# Patient Record
Sex: Male | Born: 1980 | Race: Black or African American | Hispanic: No | Marital: Single | Smoking: Current every day smoker
Health system: Southern US, Community
[De-identification: ages and names within clinical notes are randomized; demographics above are authoritative.]

## PROBLEM LIST (undated history)

## (undated) DIAGNOSIS — Z789 Other specified health status: Secondary | ICD-10-CM

## (undated) HISTORY — PX: NO PAST SURGERIES: SHX2092

---

## 2013-10-01 ENCOUNTER — Emergency Department (HOSPITAL_COMMUNITY)
Admission: EM | Admit: 2013-10-01 | Discharge: 2013-10-01 | Disposition: A | Discharge status: Home / Self Care | Payer: Self-pay | Attending: Emergency Medicine | Admitting: Emergency Medicine

## 2013-10-01 ENCOUNTER — Encounter (HOSPITAL_COMMUNITY): Payer: Self-pay | Admitting: Emergency Medicine

## 2013-10-01 DIAGNOSIS — F17200 Nicotine dependence, unspecified, uncomplicated: Secondary | ICD-10-CM | POA: Insufficient documentation

## 2013-10-01 DIAGNOSIS — IMO0002 Reserved for concepts with insufficient information to code with codable children: Secondary | ICD-10-CM | POA: Insufficient documentation

## 2013-10-01 DIAGNOSIS — M62838 Other muscle spasm: Secondary | ICD-10-CM

## 2013-10-01 DIAGNOSIS — M7918 Myalgia, other site: Secondary | ICD-10-CM

## 2013-10-01 DIAGNOSIS — M542 Cervicalgia: Secondary | ICD-10-CM | POA: Insufficient documentation

## 2013-10-01 DIAGNOSIS — M25519 Pain in unspecified shoulder: Secondary | ICD-10-CM | POA: Insufficient documentation

## 2013-10-01 DIAGNOSIS — M25511 Pain in right shoulder: Secondary | ICD-10-CM

## 2013-10-01 DIAGNOSIS — M75100 Unspecified rotator cuff tear or rupture of unspecified shoulder, not specified as traumatic: Secondary | ICD-10-CM | POA: Insufficient documentation

## 2013-10-01 DIAGNOSIS — M25512 Pain in left shoulder: Secondary | ICD-10-CM

## 2013-10-01 MED ORDER — OXYCODONE-ACETAMINOPHEN 5-325 MG PO TABS
1.0000 | ORAL_TABLET | Freq: Once | ORAL | Status: AC
  Administered 2013-10-01: 1 via ORAL
  Filled 2013-10-01: qty 1

## 2013-10-01 MED ORDER — CYCLOBENZAPRINE HCL 10 MG PO TABS: 10.0000 mg | ORAL_TABLET | Freq: Two times a day (BID) | ORAL | Status: DC | PRN

## 2013-10-01 MED ORDER — HYDROCODONE-ACETAMINOPHEN 5-325 MG PO TABS: 2.0000 | ORAL_TABLET | ORAL | Status: DC | PRN

## 2013-10-01 MED ORDER — NAPROXEN 500 MG PO TABS: 500.0000 mg | ORAL_TABLET | Freq: Two times a day (BID) | ORAL | Status: DC

## 2013-10-01 NOTE — Discharge Instructions (Signed)
Shoulder Pain The shoulder is the joint that connects your arms to your body. The bones that form the shoulder joint include the upper arm bone (humerus), the shoulder blade (scapula), and the collarbone (clavicle). The top of the humerus is shaped like a ball and fits into a rather flat socket on the scapula (glenoid cavity). A combination of muscles and strong, fibrous tissues that connect muscles to bones (tendons) support your shoulder joint and hold the ball in the socket. Small, fluid-filled sacs (bursae) are located in different areas of the joint. They act as cushions between the bones and the overlying soft tissues and help reduce friction between the gliding tendons and the bone as you move your arm. Your shoulder joint allows a wide range of motion in your arm. This range of motion allows you to do things like scratch your back or throw a ball. However, this range of motion also makes your shoulder more prone to pain from overuse and injury. Causes of shoulder pain can originate from both injury and overuse and usually can be grouped in the following four categories:  Redness, swelling, and pain (inflammation) of the tendon (tendinitis) or the bursae (bursitis).  Instability, such as a dislocation of the joint.  Inflammation of the joint (arthritis).  Broken bone (fracture). HOME CARE INSTRUCTIONS   Apply ice to the sore area.  Put ice in a plastic bag.  Place a towel between your skin and the bag.  Leave the ice on for 15-20 minutes, 3-4 times per day for the first 2 days, or as directed by your health care provider.  Stop using cold packs if they do not help with the pain.  If you have a shoulder sling or immobilizer, wear it as long as your caregiver instructs. Only remove it to shower or bathe. Move your arm as little as possible, but keep your hand moving to prevent swelling.  Squeeze a soft ball or foam pad as much as possible to help prevent swelling.  Only take  over-the-counter or prescription medicines for pain, discomfort, or fever as directed by your caregiver. SEEK MEDICAL CARE IF:   Your shoulder pain increases, or new pain develops in your arm, hand, or fingers.  Your hand or fingers become cold and numb.  Your pain is not relieved with medicines. SEEK IMMEDIATE MEDICAL CARE IF:   Your arm, hand, or fingers are numb or tingling.  Your arm, hand, or fingers are significantly swollen or turn white or blue. MAKE SURE YOU:   Understand these instructions.  Will watch your condition.  Will get help right away if you are not doing well or get worse. Document Released: 10/15/2004 Document Revised: 05/22/2013 Document Reviewed: 12/20/2010 [facility] Patient Information 2015 [place], [place]. This information is not intended to replace advice given to you by your health care provider. Make sure you discuss any questions you have with your health care provider.  Trigger Point Injection Trigger points are areas where you have muscle pain. A trigger point injection is a shot given in the trigger point to relieve that pain. A trigger point might feel like a knot in your muscle. It hurts to press on a trigger point. Sometimes the pain spreads out (radiates) to other parts of the body. For example, pressing on a trigger point in your shoulder might cause pain in your arm or neck. You might have one trigger point. Or, you might have more than one. People often have trigger points in their upper back  and lower back. They also occur often in the neck and shoulders. Pain from a trigger point lasts for a long time. It can make it hard to keep moving. You might not be able to do the exercise or physical therapy that could help you deal with the pain. A trigger point injection may help. It does not work for everyone. But, it may relieve your pain for a few days or a few months. A trigger point injection does not cure long-lasting (chronic) pain. LET YOUR CAREGIVER  KNOW ABOUT:  Any allergies (especially to latex, lidocaine, or steroids).  Blood-thinning medicines that you take. These drugs can lead to bleeding or bruising after an injection. They include:  Aspirin.  Ibuprofen.  Clopidogrel.  Warfarin.  Other medicines you take. This includes all vitamins, herbs, eyedrops, over-the-counter medicines, and creams.  Use of steroids.  Recent infections.  Past problems with numbing medicines.  Bleeding problems.  Surgeries you have had.  Other health problems. RISKS AND COMPLICATIONS A trigger point injection is a safe treatment. However, problems may develop, such as:  Minor side effects usually go away in 1 to 2 days. These may include:  Soreness.  Bruising.  Stiffness.  More serious problems are rare. But, they may include:  Bleeding under the skin (hematoma).  Skin infection.  Breaking off of the needle under your skin.  Lung puncture.  The trigger point injection may not work for you. BEFORE THE PROCEDURE You may need to stop taking any medicine that thins your blood. This is to prevent bleeding and bruising. Usually these medicines are stopped several days before the injection. No other preparation is needed. PROCEDURE  A trigger point injection can be given in your caregiver's office or in a clinic. Each injection takes 2 minutes or less.  Your caregiver will feel for trigger points. The caregiver may use a marker to circle the area for the injection.  The skin over the trigger point will be washed with a germ-killing (antiseptic) solution.  The caregiver pinches the spot for the injection.  Then, a very thin needle is used for the shot. You may feel pain or a twitching feeling when the needle enters the trigger point.  A numbing solution may be injected into the trigger point. Sometimes a drug to keep down swelling, redness, and warmth (inflammation) is also injected.  Your caregiver moves the needle around the  trigger zone until the tightness and twitching goes away.  After the injection, your caregiver may put gentle pressure over the injection site.  Then it is covered with a bandage. AFTER THE PROCEDURE  You can go right home after the injection.  The bandage can be taken off after a few hours.  You may feel sore and stiff for 1 to 2 days.  Go back to your regular activities slowly. Your caregiver may ask you to stretch your muscles. Do not do anything that takes extra energy for a few days.  Follow your caregiver's instructions to manage and treat other pain. Document Released: 12/25/2010 Document Revised: 05/02/2012 Document Reviewed: 12/25/2010 [facility] Patient Information 2015 [place], [place]. This information is not intended to replace advice given to you by your health care provider. Make sure you discuss any questions you have with your health care provider.

## 2013-10-01 NOTE — ED Provider Notes (Signed)
CSN: [id number]     Arrival date & time 10/01/13  1043 History  This chart was scribed for non-physician practitioner Arthor Captain working with Flint Melter, MD by Annye Asa, ED Scribe. This patient was seen in room TR05C/TR05C and the patient's care was started at 12:05 PM.   Chief Complaint  Patient presents with  . Neck Pain  . Shoulder Pain   Patient is a 33 y.o. male presenting with neck pain and shoulder pain. The history is provided by the patient. No language interpreter was used.  Neck Pain Shoulder Pain   HPI Comments: [name] [name] is a 33 y.o. male who presents to the Emergency Department complaining of bilateral shoulder pain with associated neck pain beginning approx. 2 days PTA.  Patient states that it is painful to turn his head or move his neck. 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   History reviewed. No pertinent past medical history. History reviewed. No pertinent past surgical history. No family history on file. History  Substance Use Topics  . Smoking status: Current Every Day Smoker  . Smokeless tobacco: Not on file  . Alcohol Use: Yes    Review of Systems  Musculoskeletal: Positive for arthralgias, myalgias and neck pain.  All other systems reviewed and are negative.   Allergies  Review of patient's allergies indicates not on file.  Home Medications   Prior to Admission medications   Not on File   BP 118/71  Pulse 64  Temp(Src) 98.5 F (36.9 C) (Oral)  Resp 16  Ht  (1.778 m)  Wt 165 lb (74.844 kg)  BMI 23.68 kg/m2  SpO2 98% Physical Exam  Nursing note and vitals reviewed. Constitutional: He is oriented to person, place, and time. He appears well-developed and well-nourished. No distress.  HENT:  Head: Normocephalic and atraumatic.  Mouth/Throat: Oropharynx is clear and moist. No oropharyngeal exudate.  Eyes: Pupils are equal, round, and reactive to light.  Neck: Neck supple.  Cardiovascular: Normal rate.    Pulmonary/Chest: Effort normal.  Musculoskeletal: He exhibits no edema.  Cervical paraspinal musclespasm  Neurological: He is alert and oriented to person, place, and time. No cranial nerve deficit.  Skin: Skin is warm and dry. No rash noted.  Psychiatric: He has a normal mood and affect. His behavior is normal.    ED Course  Procedures (including critical care time)  DIAGNOSTIC STUDIES: Oxygen Saturation is 98% on RA, normal by my interpretation.    COORDINATION OF CARE:  12:10 PM Will be discharged with pain medication after discussion of home care. The patient agreed to the treatment plan.    Labs Review Labs Reviewed - No data to display  Imaging Review No results found.   EKG Interpretation None      MDM   Final diagnoses:  Bilateral shoulder pain  Muscle spasms of neck  Levator scapula syndrome    Patient with back pain.  No neurological deficits and normal neuro exam.  Patient can walk but states is painful.  No loss of bowel or bladder control.  No concern for cauda equina.  No fever, night sweats, weight loss, h/o cancer, IVDU.  RICE protocol and pain medicine indicated and discussed with patient.   I personally performed the services described in this documentation, which was scribed in my presence. The recorded information has been reviewed and is accurate.       Arthor Captain, PA-C 10/01/13 1706

## 2013-10-01 NOTE — ED Provider Notes (Signed)
Medical screening examination/treatment/procedure(s) were performed by non-physician practitioner and as supervising physician I was immediately available for consultation/collaboration.   EKG Interpretation None       Flint Melter, MD 10/01/13 1752

## 2013-10-01 NOTE — ED Notes (Signed)
Declined W/C at D/C and was escorted to lobby by RN. 

## 2013-10-01 NOTE — ED Notes (Signed)
Pt. Stated, I started having neck pain and shoulder pain for 2 days.  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

## 2014-01-17 ENCOUNTER — Encounter (HOSPITAL_COMMUNITY): Payer: Self-pay | Admitting: Emergency Medicine

## 2014-01-17 ENCOUNTER — Emergency Department (HOSPITAL_COMMUNITY): Payer: Self-pay

## 2014-01-17 ENCOUNTER — Inpatient Hospital Stay (HOSPITAL_COMMUNITY)
Admission: EM | Admit: 2014-01-17 | Discharge: 2014-01-18 | DRG: 195 | Disposition: A | Discharge status: Home / Self Care | Payer: Self-pay | Attending: Internal Medicine | Admitting: Internal Medicine

## 2014-01-17 DIAGNOSIS — E876 Hypokalemia: Secondary | ICD-10-CM | POA: Diagnosis present

## 2014-01-17 DIAGNOSIS — F17210 Nicotine dependence, cigarettes, uncomplicated: Secondary | ICD-10-CM | POA: Diagnosis present

## 2014-01-17 DIAGNOSIS — Z7982 Long term (current) use of aspirin: Secondary | ICD-10-CM

## 2014-01-17 DIAGNOSIS — R778 Other specified abnormalities of plasma proteins: Secondary | ICD-10-CM | POA: Diagnosis present

## 2014-01-17 DIAGNOSIS — R05 Cough: Secondary | ICD-10-CM

## 2014-01-17 DIAGNOSIS — Z7901 Long term (current) use of anticoagulants: Secondary | ICD-10-CM

## 2014-01-17 DIAGNOSIS — Z720 Tobacco use: Secondary | ICD-10-CM | POA: Diagnosis present

## 2014-01-17 DIAGNOSIS — R7989 Other specified abnormal findings of blood chemistry: Secondary | ICD-10-CM | POA: Diagnosis present

## 2014-01-17 DIAGNOSIS — R079 Chest pain, unspecified: Secondary | ICD-10-CM | POA: Diagnosis present

## 2014-01-17 DIAGNOSIS — R945 Abnormal results of liver function studies: Secondary | ICD-10-CM | POA: Diagnosis present

## 2014-01-17 DIAGNOSIS — R0789 Other chest pain: Secondary | ICD-10-CM

## 2014-01-17 DIAGNOSIS — R059 Cough, unspecified: Secondary | ICD-10-CM

## 2014-01-17 DIAGNOSIS — J189 Pneumonia, unspecified organism: Principal | ICD-10-CM | POA: Diagnosis present

## 2014-01-17 HISTORY — DX: Other specified health status: Z78.9

## 2014-01-17 LAB — URINE MICROSCOPIC-ADD ON

## 2014-01-17 LAB — CBC WITH DIFFERENTIAL/PLATELET
BASOS ABS: 0 10*3/uL (ref 0.0–0.1)
BASOS PCT: 0 % (ref 0–1)
Basophils Absolute: 0 10*3/uL (ref 0.0–0.1)
Basophils Relative: 0 % (ref 0–1)
EOS PCT: 0 % (ref 0–5)
Eosinophils Absolute: 0 10*3/uL (ref 0.0–0.7)
Eosinophils Absolute: 0 10*3/uL (ref 0.0–0.7)
Eosinophils Relative: 1 % (ref 0–5)
HCT: 39.2 % (ref 39.0–52.0)
HEMATOCRIT: 34.7 % — AB (ref 39.0–52.0)
HEMOGLOBIN: 13.7 g/dL (ref 13.0–17.0)
Hemoglobin: 12 g/dL — ABNORMAL LOW (ref 13.0–17.0)
LYMPHS PCT: 33 % (ref 12–46)
Lymphocytes Relative: 34 % (ref 12–46)
Lymphs Abs: 2.8 10*3/uL (ref 0.7–4.0)
Lymphs Abs: 2.2 10*3/uL (ref 0.7–4.0)
MCH: 31.1 pg (ref 26.0–34.0)
MCH: 30.4 pg (ref 26.0–34.0)
MCHC: 34.9 g/dL (ref 30.0–36.0)
MCHC: 34.6 g/dL (ref 30.0–36.0)
MCV: 88.9 fL (ref 78.0–100.0)
MCV: 87.8 fL (ref 78.0–100.0)
MONO ABS: 1.5 10*3/uL — AB (ref 0.1–1.0)
Monocytes Absolute: 1.5 10*3/uL — ABNORMAL HIGH (ref 0.1–1.0)
Monocytes Relative: 17 % — ABNORMAL HIGH (ref 3–12)
Monocytes Relative: 23 % — ABNORMAL HIGH (ref 3–12)
Neutro Abs: 4.3 10*3/uL (ref 1.7–7.7)
Neutro Abs: 2.8 10*3/uL (ref 1.7–7.7)
Neutrophils Relative %: 50 % (ref 43–77)
Neutrophils Relative %: 42 % — ABNORMAL LOW (ref 43–77)
Platelets: 238 10*3/uL (ref 150–400)
Platelets: 264 10*3/uL (ref 150–400)
RBC: 4.41 MIL/uL (ref 4.22–5.81)
RBC: 3.95 MIL/uL — AB (ref 4.22–5.81)
RDW: 12.1 % (ref 11.5–15.5)
RDW: 12 % (ref 11.5–15.5)
WBC: 8.6 10*3/uL (ref 4.0–10.5)
WBC: 6.6 10*3/uL (ref 4.0–10.5)

## 2014-01-17 LAB — COMPREHENSIVE METABOLIC PANEL
ALT: 17 U/L (ref 0–53)
ANION GAP: 8 (ref 5–15)
AST: 41 U/L — ABNORMAL HIGH (ref 0–37)
Albumin: 3.7 g/dL (ref 3.5–5.2)
Alkaline Phosphatase: 52 U/L (ref 39–117)
BILIRUBIN TOTAL: 2.3 mg/dL — AB (ref 0.3–1.2)
BUN: 10 mg/dL (ref 6–23)
CHLORIDE: 102 meq/L (ref 96–112)
CO2: 23 mmol/L (ref 19–32)
Calcium: 8.9 mg/dL (ref 8.4–10.5)
Creatinine, Ser: 0.91 mg/dL (ref 0.50–1.35)
GFR calc Af Amer: >90 mL/min (ref >90)
GFR calc non Af Amer: >90 mL/min (ref >90)
Glucose, Bld: 92 mg/dL (ref 70–99)
Potassium: 4.1 mmol/L (ref 3.5–5.1)
Sodium: 133 mmol/L — ABNORMAL LOW (ref 135–145)
Total Protein: 7.4 g/dL (ref 6.0–8.3)

## 2014-01-17 LAB — URINALYSIS, ROUTINE W REFLEX MICROSCOPIC
Glucose, UA: NEGATIVE mg/dL
Ketones, ur: 15 mg/dL — AB
Leukocytes, UA: NEGATIVE
Nitrite: NEGATIVE
PROTEIN: NEGATIVE mg/dL
Specific Gravity, Urine: 1.023 (ref 1.005–1.030)
UROBILINOGEN UA: 1 mg/dL (ref 0.0–1.0)
pH: 5.5 (ref 5.0–8.0)

## 2014-01-17 LAB — CREATININE, SERUM: CREATININE: 0.77 mg/dL (ref 0.50–1.35)

## 2014-01-17 LAB — RAPID URINE DRUG SCREEN, HOSP PERFORMED
Amphetamines: NOT DETECTED
Barbiturates: NOT DETECTED
Benzodiazepines: NOT DETECTED
COCAINE: NOT DETECTED
Opiates: NOT DETECTED
Tetrahydrocannabinol: POSITIVE — AB

## 2014-01-17 LAB — TROPONIN I
TROPONIN I: 0.19 ng/mL — AB (ref <0.031)
Troponin I: 0.05 ng/mL — ABNORMAL HIGH (ref <0.031)

## 2014-01-17 LAB — LIPASE, BLOOD: Lipase: 19 U/L (ref 11–59)

## 2014-01-17 LAB — D-DIMER, QUANTITATIVE (NOT AT ARMC): D DIMER QUANT: 0.74 ug{FEU}/mL — AB (ref 0.00–0.48)

## 2014-01-17 MED ORDER — ASPIRIN 325 MG PO TABS
325.0000 mg | ORAL_TABLET | Freq: Every day | ORAL | Status: DC
  Administered 2014-01-18: 325 mg via ORAL
  Filled 2014-01-17: qty 1

## 2014-01-17 MED ORDER — MORPHINE SULFATE 2 MG/ML IJ SOLN
1.0000 mg | INTRAMUSCULAR | Status: DC | PRN
Start: 2014-01-17 — End: 2014-01-18
  Administered 2014-01-17: 1 mg via INTRAVENOUS
  Filled 2014-01-17: qty 1

## 2014-01-17 MED ORDER — SODIUM CHLORIDE 0.9 % IV SOLN
INTRAVENOUS | Status: DC
  Administered 2014-01-17: 23:00:00 via INTRAVENOUS

## 2014-01-17 MED ORDER — AZITHROMYCIN 250 MG PO TABS: 250.0000 mg | ORAL_TABLET | Freq: Every day | ORAL | Status: DC

## 2014-01-17 MED ORDER — ONDANSETRON HCL 4 MG/2ML IJ SOLN
4.0000 mg | Freq: Once | INTRAMUSCULAR | Status: AC
  Administered 2014-01-17: 4 mg via INTRAVENOUS
  Filled 2014-01-17: qty 2

## 2014-01-17 MED ORDER — LEVOFLOXACIN IN D5W 750 MG/150ML IV SOLN
750.0000 mg | INTRAVENOUS | Status: DC
  Administered 2014-01-17: 750 mg via INTRAVENOUS
  Filled 2014-01-17 (×2): qty 150

## 2014-01-17 MED ORDER — SODIUM CHLORIDE 0.9 % IV BOLUS (SEPSIS)
1000.0000 mL | Freq: Once | INTRAVENOUS | Status: AC
Start: 2014-01-17 — End: 2014-01-17
  Administered 2014-01-17: 1000 mL via INTRAVENOUS

## 2014-01-17 MED ORDER — ONDANSETRON HCL 4 MG PO TABS: 4.0000 mg | ORAL_TABLET | Freq: Four times a day (QID) | ORAL | Status: DC

## 2014-01-17 MED ORDER — ACETAMINOPHEN 500 MG PO TABS
1000.0000 mg | ORAL_TABLET | Freq: Once | ORAL | Status: AC
  Administered 2014-01-17: 1000 mg via ORAL
  Filled 2014-01-17: qty 2

## 2014-01-17 MED ORDER — ASPIRIN 81 MG PO CHEW
324.0000 mg | CHEWABLE_TABLET | Freq: Once | ORAL | Status: AC
  Administered 2014-01-17: 324 mg via ORAL
  Filled 2014-01-17: qty 4

## 2014-01-17 MED ORDER — HYDROCODONE-ACETAMINOPHEN 5-325 MG PO TABS: 2.0000 | ORAL_TABLET | ORAL | Status: DC | PRN

## 2014-01-17 MED ORDER — DEXTROSE 5 % IV SOLN
500.0000 mg | Freq: Once | INTRAVENOUS | Status: AC
  Administered 2014-01-17: 500 mg via INTRAVENOUS
  Filled 2014-01-17: qty 500

## 2014-01-17 MED ORDER — CYCLOBENZAPRINE HCL 10 MG PO TABS
10.0000 mg | ORAL_TABLET | Freq: Two times a day (BID) | ORAL | Status: DC | PRN
  Administered 2014-01-17: 10 mg via ORAL
  Filled 2014-01-17 (×2): qty 1

## 2014-01-17 MED ORDER — CEFTRIAXONE SODIUM 1 G IJ SOLR
1.0000 g | Freq: Once | INTRAMUSCULAR | Status: AC
  Administered 2014-01-17: 1 g via INTRAVENOUS
  Filled 2014-01-17: qty 10

## 2014-01-17 MED ORDER — ENOXAPARIN SODIUM 40 MG/0.4ML ~~LOC~~ SOLN
40.0000 mg | SUBCUTANEOUS | Status: DC
  Administered 2014-01-17: 40 mg via SUBCUTANEOUS
  Filled 2014-01-17: qty 0.4

## 2014-01-17 MED ORDER — BENZONATATE 100 MG PO CAPS: 100.0000 mg | ORAL_CAPSULE | Freq: Three times a day (TID) | ORAL | Status: DC | PRN

## 2014-01-17 MED ORDER — SODIUM CHLORIDE 0.9 % IV BOLUS (SEPSIS)
1000.0000 mL | Freq: Once | INTRAVENOUS | Status: AC
  Administered 2014-01-17: 1000 mL via INTRAVENOUS

## 2014-01-17 NOTE — ED Notes (Signed)
Pt c/o muscle stiffness and weakness, flu like symptoms(cough, sneezing, runny nose). Ambulatory on scene.

## 2014-01-17 NOTE — Progress Notes (Signed)
The D-Dimer results were elevated and the PCP on call was notified.

## 2014-01-17 NOTE — ED Provider Notes (Addendum)
Discussed case in great detail with [name] MowJoe Mintz, PA-C. Transfer of care to this provider change in shift.  Plan: Awaiting troponin and urinalysis. If negative patient can be discharged.  Results for orders placed or performed during the hospital encounter of 01/17/14  CBC with Differential  Result Value Ref Range   WBC 8.6 4.0 - 10.5 K/uL   RBC 4.41 4.22 - 5.81 MIL/uL   Hemoglobin 13.7 13.0 - 17.0 g/dL   HCT 56.339.2 87.539.0 - 64.352.0 %   MCV 88.9 78.0 - 100.0 fL   MCH 31.1 26.0 - 34.0 pg   MCHC 34.9 30.0 - 36.0 g/dL   RDW 32.912.1 51.811.5 - 84.115.5 %   Platelets 238 150 - 400 K/uL   Neutrophils Relative % 50 43 - 77 %   Lymphocytes Relative 33 12 - 46 %   Monocytes Relative 17 (H) 3 - 12 %   Eosinophils Relative 0 0 - 5 %   Basophils Relative 0 0 - 1 %   Neutro Abs 4.3 1.7 - 7.7 K/uL   Lymphs Abs 2.8 0.7 - 4.0 K/uL   Monocytes Absolute 1.5 (H) 0.1 - 1.0 K/uL   Eosinophils Absolute 0.0 0.0 - 0.7 K/uL   Basophils Absolute 0.0 0.0 - 0.1 K/uL  Comprehensive metabolic panel  Result Value Ref Range   Sodium 133 (L) 135 - 145 mmol/L   Potassium 4.1 3.5 - 5.1 mmol/L   Chloride 102 96 - 112 mEq/L   CO2 23 19 - 32 mmol/L   Glucose, Bld 92 70 - 99 mg/dL   BUN 10 6 - 23 mg/dL   Creatinine, Ser 6.600.91 0.50 - 1.35 mg/dL   Calcium 8.9 8.4 - 63.010.5 mg/dL   Total Protein 7.4 6.0 - 8.3 g/dL   Albumin 3.7 3.5 - 5.2 g/dL   AST 41 (H) 0 - 37 U/L   ALT 17 0 - 53 U/L   Alkaline Phosphatase 52 39 - 117 U/L   Total Bilirubin 2.3 (H) 0.3 - 1.2 mg/dL   GFR calc non Af Amer >90 >90 mL/min   GFR calc Af Amer >90 >90 mL/min   Anion gap 8 5 - 15  Lipase, blood  Result Value Ref Range   Lipase 19 11 - 59 U/L  Urinalysis, Routine w reflex microscopic  Result Value Ref Range   Color, Urine AMBER (A) YELLOW   APPearance CLEAR CLEAR   Specific Gravity, Urine 1.023 1.005 - 1.030   pH 5.5 5.0 - 8.0   Glucose, UA NEGATIVE NEGATIVE mg/dL   Hgb urine dipstick MODERATE (A) NEGATIVE   Bilirubin Urine SMALL (A) NEGATIVE   Ketones, ur 15 (A) NEGATIVE mg/dL   Protein, ur NEGATIVE NEGATIVE mg/dL   Urobilinogen, UA 1.0 0.0 - 1.0 mg/dL   Nitrite NEGATIVE NEGATIVE   Leukocytes, UA NEGATIVE NEGATIVE  Troponin I  Result Value Ref Range   Troponin I 0.05 (H) <0.031 ng/mL  Urine microscopic-add on  Result Value Ref Range   Squamous Epithelial / LPF RARE RARE   WBC, UA 0-2 <3 WBC/hpf   RBC / HPF 3-6 <3 RBC/hpf   Bacteria, UA FEW (A) RARE   Urine-Other MUCOUS PRESENT    Dg Chest 2 View  01/17/2014   CLINICAL DATA:  Generalized body aches for 5 days. Chest pain. Smoker.  EXAM: CHEST  2 VIEW  COMPARISON:  None.  FINDINGS: Consolidation noted in the right middle lobe compatible with pneumonia. Left lung is clear. Heart is normal size. Mediastinal contours  are within normal limits. No effusions. No acute bony abnormality.  IMPRESSION: Right middle lobe pneumonia.   Electronically Signed   By: Charlett NoseKevin  [name] M.D.   On: 01/17/2014 15:49    Medications  cefTRIAXone (ROCEPHIN) 1 g in dextrose 5 % 50 mL IVPB (1 g Intravenous New Bag/Given 01/17/14 1947)  azithromycin (ZITHROMAX) 500 mg in dextrose 5 % 250 mL IVPB (not administered)  sodium chloride 0.9 % bolus 1,000 mL (0 mLs Intravenous Stopped 01/17/14 1642)  ondansetron (ZOFRAN) injection 4 mg (4 mg Intravenous Given 01/17/14 1526)  sodium chloride 0.9 % bolus 1,000 mL (0 mLs Intravenous Stopped 01/17/14 1731)  acetaminophen (TYLENOL) tablet 1,000 mg (1,000 mg Oral Given 01/17/14 1647)  aspirin chewable tablet 324 mg (324 mg Oral Given 01/17/14 1941)    Filed Vitals:   01/17/14 1804 01/17/14 1925 01/17/14 1940 01/17/14 1953  BP:  140/86 127/72   Pulse:  72 65   Temp:      TempSrc:      Resp:  16 0 16  SpO2: 98% 99% 100%     [name] [name] is a 33 y/o M with no known significant PMHx presenting to the ED with chest pain, cough, and generalized bodyaches. Patient reported that on Christmas Day was when he started to not feel well. Patient reported that the first  symptom that he started to experience was chest pain localized to the center and left side of the chest. Stated that she chest pain has increased since coughing - worse with cough and deep inhalation. Patient reported that he has been having intermittent hot and cold sensations. Reported that he has been having a mild headache - denied thunderclap or worst headache of life. Denied heroin, cocaine, marijuana. Denied sick contacts. Denied abdominal pain, difficulty breathing, fever, urinary symptoms, melena, hematochezia. Smokes less than a half a pack of cigarettes per day.  PCP none    EKG noted normal sinus rhythm with a heart rate of 81 bpm. Troponin mildly elevated at 0.05. CBC negative elevated white blood cell count. Hemoglobin 13.7, hematocrit 39.2. CMP noted mild hyponatremia of 133. Lipase negative elevation. Urinalysis noted moderate hemoglobin with small bilirubin-negative nitrites or leukocytes noted. Chest x-ray noted right middle lobe pneumonia. Patient ambulated well - pulse ox 98% on room air without any signs of exacerbation or difficulty breathing.   7:14 PM Concerned regarding mildly elevated Troponin of 0.05 when patient had history of first presenting symptom to be chest pain prior to cough. Patient does have known right middle lobe pneumonia.   7:21 PM This provider spoke with Dr. Arvilla MarketA. Kakrakandy, [facility]. Discussed case, labs, imaging, vitals, ED course in great detail. Patient to be admitted to Telemetry for cardiac rule out and serial troponins. Recommended antibiotics to be started while in the ED for pneumonia. Agreed with cardiology consultation to be ordered.  7:54 PM This provider spoke with cardiology, Dr. Purvis SheffieldKoneswaran - on-call physician for cardiology. Discussed case in great detail. Cardiology to come and assess patient.  9:26 PM Dr. Purvis SheffieldKoneswaran called this provider and stated that if troponin becomes elevated to let cardiology know ASAP and that Cardiology will  then come and see the patient.   9:48 PM Dr. Ronelle NighMaclean spoke with this provider, Cardiology, reported for Internal Medicine to see patient and to decipher whether or not the patient needs to be placed on Heparin, but does not believe so.  Patient presenting to the ED with right middle lobe pneumonia noted on chest x-ray. Troponin and  urine pending-results noted mildly elevated troponin of 0.05. Patient's presenting symptom was chest pain. Patient is a 33 year old male with no known past medical history. Patient to be admitted to the hospital for telemetry for observation with serial troponins. Patient started on IV antibiotics. ASA given in the ED setting. Patient admitted to [facility] - discussed with Dr. Toniann Fail what cardiology recommended and reported regarding if troponin levels become elevated. Discussed plan for admission with patient who agrees and understands plan. Patient stable for transfer.  Raymon Mutton, PA-C 01/17/14 [address], PA-C 01/17/14 [address], PA-C 01/17/14 [address], PA-C 01/17/14 2314  Arby Barrette, MD 01/18/14 0031  Raymon Mutton, PA-C 01/18/14 1610  Arby Barrette, MD 01/21/14 1816

## 2014-01-17 NOTE — Progress Notes (Signed)
The patient's Troponon level was 0.19. PCP on call was notified.

## 2014-01-17 NOTE — Progress Notes (Signed)
  CARE MANAGEMENT ED NOTE 01/17/2014  Patient:  [name] [name]   Account Number:  [id number]  Date Initiated:  01/17/2014  Documentation initiated by:  Radford PaxFERRERO,Delorice Bannister  Subjective/Objective Assessment:   Patient presents to Ed with chest pain, flu like symptoms     Subjective/Objective Assessment Detail:   chest xray Right middle lobe pneumonia.  Troponin .05     Action/Plan:   Cardiology consult, IV abx for pneumonia   Action/Plan Detail:   Anticipated DC Date:       Status Recommendation to Physician:   Result of Recommendation:    Other ED Services  Consult Working Plan    DC Planning Services  Other  PCP issues  Medication Assistance    Choice offered to / List presented to:            Status of service:  Completed, signed off  ED Comments:   ED Comments Detail:  Patient originally to be discharged from ED with RX for zpac and phenergan for nausea, however troponin slightly elevated.  Cardiology consulted, patient admitted observation.  EDCM spoke to patient at bedside. Patient confirms he [place] a pcp [place]  ED[facility], informed patient of services there and walk in times. EDCM also provided patient with list of pcps who accept self pay patients, list of discount pharmacies and websites needymeds.org and GoodRX.com for medication assistance, phone number to inquire about the orange card, phone number to inquire about [facility], phone number to inquire about the Affordable Care Act, financial resources in the community such as local churches, [employer], urban ministries, and dental assistance for uninsured patients. Patient thankful for resources.  No further EDCM needs at this time.

## 2014-01-17 NOTE — ED Provider Notes (Signed)
CSN: [id number]     Arrival date & time 01/17/14  1347 History   First MD Initiated Contact with Patient 01/17/14 1439     Chief Complaint  Patient presents with  . Generalized Body Aches     (Consider location/radiation/quality/duration/timing/severity/associated sxs/prior Treatment) HPI [name] [name] is a 33 year old male with no significant past medical history who presents the ER complaining of generalized body aches, chest congestion/cough, 6 days with 2 days of nausea and vomiting. Patient states his symptoms began gradually last week, and have persisted. Patient reports a mildly productive cough, with some mild associated midsternal chest discomfort when coughing and with inspiration only. Patient states over the last 2 days, he has experienced consistent nausea, with 2-3 episodes of nonbilious, nonbloody vomiting. Patient also reports associated sore throat, without dysphagia. Patient denies dizziness, weakness, shortness of breath, abdominal pain, diarrhea, dysuria. Patient reports having similar signs symptoms previously, which he states he had a pneumonia. Patient states he is also a current smoker. Patient states he had the symptoms when he lived in [place].   History reviewed. No pertinent past medical history. History reviewed. No pertinent past surgical history. No family history on file. History  Substance Use Topics  . Smoking status: Current Every Day Smoker  . Smokeless tobacco: Not on file  . Alcohol Use: Yes    Review of Systems  Constitutional: Negative for fever.  HENT: Negative for trouble swallowing.   Eyes: Negative for visual disturbance.  Respiratory: Positive for cough. Negative for shortness of breath.   Cardiovascular: Negative for chest pain.  Gastrointestinal: Positive for nausea and vomiting. Negative for abdominal pain.  Genitourinary: Negative for dysuria.  Musculoskeletal: Negative for neck pain.  Skin: Negative for rash.  Neurological: Negative  for dizziness, weakness and numbness.  Psychiatric/Behavioral: Negative.       Allergies  Review of patient's allergies indicates no known allergies.  Home Medications   Prior to Admission medications   Medication Sig Start Date End Date Taking? Authorizing Provider  cyclobenzaprine (FLEXERIL) 10 MG tablet Take 1 tablet (10 mg total) by mouth 2 (two) times daily as needed for muscle spasms. 10/01/13  Yes Arthor CaptainAbigail Harris, PA-C  ibuprofen (ADVIL,MOTRIN) 200 MG tablet Take 400 mg by mouth every 6 (six) hours as needed (migraine).   Yes Historical Provider, MD  azithromycin (ZITHROMAX) 250 MG tablet Take 1 tablet (250 mg total) by mouth daily. Take first 2 tablets together, then 1 every day until finished. 01/17/14   Monte FantasiaJoseph W Matt Delpizzo, PA-C  HYDROcodone-acetaminophen (NORCO) 5-325 MG per tablet Take 2 tablets by mouth every 4 (four) hours as needed. 10/01/13   Arthor CaptainAbigail Harris, PA-C  naproxen (NAPROSYN) 500 MG tablet Take 1 tablet (500 mg total) by mouth 2 (two) times daily with a meal. 10/01/13   Arthor CaptainAbigail Harris, PA-C  ondansetron (ZOFRAN) 4 MG tablet Take 1 tablet (4 mg total) by mouth every 6 (six) hours. 01/17/14   Monte FantasiaJoseph W Linnae Rasool, PA-C   BP 120/78 mmHg  Pulse 71  Temp(Src) 98.1 F (36.7 C) (Oral)  Resp 20  SpO2 100% Physical Exam  Constitutional: He is oriented to person, place, and time. He appears well-developed and well-nourished. No distress.  HENT:  Head: Normocephalic and atraumatic.  Mouth/Throat: Oropharynx is clear and moist. No oropharyngeal exudate.  Eyes: Right eye exhibits no discharge. Left eye exhibits no discharge. No scleral icterus.  Neck: Normal range of motion.  Cardiovascular: Normal rate, regular rhythm and normal heart sounds.   No murmur heard. Pulmonary/Chest: Effort  normal and breath sounds normal. No respiratory distress.  Abdominal: Soft. There is no tenderness.  Musculoskeletal: Normal range of motion. He exhibits no edema or tenderness.  Neurological: He is  alert and oriented to person, place, and time. No cranial nerve deficit. Coordination normal.  Skin: Skin is warm and dry. No rash noted. He is not diaphoretic.  Psychiatric: He has a normal mood and affect.  Nursing note and vitals reviewed.   ED Course  Procedures (including critical care time) Labs Review Labs Reviewed  CBC WITH DIFFERENTIAL - Abnormal; Notable for the following:    Monocytes Relative 17 (*)    Monocytes Absolute 1.5 (*)    All other components within normal limits  COMPREHENSIVE METABOLIC PANEL - Abnormal; Notable for the following:    Sodium 133 (*)    AST 41 (*)    Total Bilirubin 2.3 (*)    All other components within normal limits  LIPASE, BLOOD  URINALYSIS, ROUTINE W REFLEX MICROSCOPIC  TROPONIN I    Imaging Review Dg Chest 2 View  01/17/2014   CLINICAL DATA:  Generalized body aches for 5 days. Chest pain. Smoker.  EXAM: CHEST  2 VIEW  COMPARISON:  None.  FINDINGS: Consolidation noted in the right middle lobe compatible with pneumonia. Left lung is clear. Heart is normal size. Mediastinal contours are within normal limits. No effusions. No acute bony abnormality.  IMPRESSION: Right middle lobe pneumonia.   Electronically Signed   By: Charlett NoseKevin  Dover M.D.   On: 01/17/2014 15:49     EKG Interpretation None      MDM   Final diagnoses:  Cough  CAP (community acquired pneumonia)    Patient here with cough, myalgias, generalized weakness patient stating he has had symptoms consistent with pneumonia he's had in the past. Patient also expressing nausea, vomiting 2 days. Labs unremarkable for acute pathology, EKG unremarkable for any obvious injury or ectopy, PERC negative, likely patient's chest discomfort is related to muscular pain from his cough to the fact that it is reproducible with palpation, coughing and inspiration only, does not sound cardiac in nature. Chest radiograph with impression of a right middle lobe pneumonia. Correlation with patient's  clinical picture, likely patient's symptoms are attributed to his pneumonia. Patient's symptoms treated in the ER with Zofran and fluid boluses.   Patient has been diagnosed with CAP via chest xray. Pt is not ill appearing, immunocompromised, and does not have multiple co morbidities, therefore I anticipate patient can be treated as an OP with abx therapy. Pt has been advised to return to the ED if symptoms worsen or they do not improve. Pt verbalizes understanding and is agreeable with plan. I discussed return precautions with patient, encouraged him to call or return to the ER should he have any questions or concerns.  [facility] with plan to f/u on results of Troponin and UA.  Plan to d/c with tx for CAP if negative.    BP 120/78 mmHg  Pulse 71  Temp(Src) 98.1 F (36.7 C) (Oral)  Resp 20  SpO2 100%  Signed,  Ladona MowJoe Kynadee Dam, PA-C 5:46 PM  Patient discussed with Dr. Azalia BilisKevin Campos, M.D.    Monte FantasiaJoseph W Norine Reddington, PA-C 01/17/14 1746  Lyanne CoKevin M Campos, MD 01/18/14 [phone]

## 2014-01-17 NOTE — ED Notes (Signed)
Bed: WA01 Expected date:  Expected time:  Means of arrival:  Comments: EMS/aches

## 2014-01-17 NOTE — Progress Notes (Signed)
ANTIBIOTIC CONSULT NOTE - INITIAL  Pharmacy Consult for Levaquin Indication: CAP  No Known Allergies  Patient Measurements: Height: 5\' 10"  (177.8 cm) Weight: 154 lb 9.6 oz (70.126 kg) IBW/kg (Calculated) : 73  Vital Signs: Temp: 98.4 F (36.9 C) (12/30 2020) Temp Source: Oral (12/30 2020) BP: 132/81 mmHg (12/30 2020) Pulse Rate: 66 (12/30 2020) Intake/Output from previous day:   Intake/Output from this shift:    Labs:  Recent Labs  01/17/14 1518  WBC 8.6  HGB 13.7  PLT 238  CREATININE 0.91   Estimated Creatinine Clearance: 113.4 mL/min (by C-G formula based on Cr of 0.91). No results for input(s): VANCOTROUGH, VANCOPEAK, VANCORANDOM, GENTTROUGH, GENTPEAK, GENTRANDOM, TOBRATROUGH, TOBRAPEAK, TOBRARND, AMIKACINPEAK, AMIKACINTROU, AMIKACIN in the last 72 hours.   Microbiology: No results found for this or any previous visit (from the past 720 hour(s)).  Medical History: Past Medical History  Diagnosis Date  . Medical history non-contributory     Assessment: 33 y/o M with no significant PMH who presents to ED with generalized body aches, chest pain, cough, and n/v.  CXR in ED shows right middle lobe pneumonia.  Pharmacy consulted to assist with dosing of Levaquin for CAP.  12/30 >> Rocephin x 1 12/30 >> Zithromax x 1 12/30 >> Levaquin >>    Tmax: 98.4 F WBCs: WNL Renal: SCr 0.91, CrCl > 100 mL/min  12/30 sputum: ordered 12/30 Legionella antigen: ordered 12/30 Strep pneumo antigen: ordered  Goal of Therapy:  Appropriate antibiotic dosing for renal function and indication Eradication of infection  Plan:   Levaquin 750 mg IV q24h.  Convert to PO when clinically able.  Follow-up renal function, cultures, clinical course.   Greer PickerelJigna Master Touchet, PharmD, BCPS Pager: [phone] 01/17/2014 10:24 PM

## 2014-01-18 ENCOUNTER — Inpatient Hospital Stay (HOSPITAL_COMMUNITY): Payer: Self-pay

## 2014-01-18 ENCOUNTER — Encounter (HOSPITAL_COMMUNITY): Payer: Self-pay

## 2014-01-18 DIAGNOSIS — R945 Abnormal results of liver function studies: Secondary | ICD-10-CM | POA: Diagnosis present

## 2014-01-18 DIAGNOSIS — R079 Chest pain, unspecified: Secondary | ICD-10-CM

## 2014-01-18 DIAGNOSIS — R7989 Other specified abnormal findings of blood chemistry: Secondary | ICD-10-CM | POA: Diagnosis present

## 2014-01-18 DIAGNOSIS — Z720 Tobacco use: Secondary | ICD-10-CM | POA: Diagnosis present

## 2014-01-18 LAB — COMPREHENSIVE METABOLIC PANEL
ALBUMIN: 3.2 g/dL — AB (ref 3.5–5.2)
ALK PHOS: 51 U/L (ref 39–117)
ALT: 19 U/L (ref 0–53)
ANION GAP: 7 (ref 5–15)
AST: 27 U/L (ref 0–37)
BUN: 9 mg/dL (ref 6–23)
CO2: 28 mmol/L (ref 19–32)
CREATININE: 0.87 mg/dL (ref 0.50–1.35)
Calcium: 8.6 mg/dL (ref 8.4–10.5)
Chloride: 102 mEq/L (ref 96–112)
GFR calc non Af Amer: >90 mL/min (ref >90)
Glucose, Bld: 83 mg/dL (ref 70–99)
POTASSIUM: 3.4 mmol/L — AB (ref 3.5–5.1)
SODIUM: 137 mmol/L (ref 135–145)
Total Bilirubin: 0.6 mg/dL (ref 0.3–1.2)
Total Protein: 6.9 g/dL (ref 6.0–8.3)

## 2014-01-18 LAB — CBC
HCT: 35.6 % — ABNORMAL LOW (ref 39.0–52.0)
HEMOGLOBIN: 12.3 g/dL — AB (ref 13.0–17.0)
MCH: 30.4 pg (ref 26.0–34.0)
MCHC: 34.6 g/dL (ref 30.0–36.0)
MCV: 87.9 fL (ref 78.0–100.0)
PLATELETS: 302 10*3/uL (ref 150–400)
RBC: 4.05 MIL/uL — AB (ref 4.22–5.81)
RDW: 12 % (ref 11.5–15.5)
WBC: 5.7 10*3/uL (ref 4.0–10.5)

## 2014-01-18 LAB — TROPONIN I
Troponin I: 0.47 ng/mL — ABNORMAL HIGH (ref <0.031)
Troponin I: 0.38 ng/mL — ABNORMAL HIGH (ref <0.031)

## 2014-01-18 LAB — HIV ANTIBODY (ROUTINE TESTING W REFLEX): HIV: NONREACTIVE

## 2014-01-18 LAB — HEPATITIS PANEL, ACUTE
HCV Ab: NEGATIVE
Hep A IgM: NONREACTIVE
Hep B C IgM: NONREACTIVE
Hepatitis B Surface Ag: NEGATIVE

## 2014-01-18 LAB — HEPARIN LEVEL (UNFRACTIONATED)
HEPARIN UNFRACTIONATED: 0.31 [IU]/mL (ref 0.30–0.70)
Heparin Unfractionated: 0.35 IU/mL (ref 0.30–0.70)

## 2014-01-18 LAB — INFLUENZA PANEL BY PCR (TYPE A & B)
H1N1 flu by pcr: NOT DETECTED
Influenza A By PCR: NEGATIVE
Influenza B By PCR: NEGATIVE

## 2014-01-18 LAB — STREP PNEUMONIAE URINARY ANTIGEN: Strep Pneumo Urinary Antigen: NEGATIVE

## 2014-01-18 LAB — PROTIME-INR
INR: 1.22 (ref 0.00–1.49)
Prothrombin Time: 15.5 seconds — ABNORMAL HIGH (ref 11.6–15.2)

## 2014-01-18 LAB — MRSA PCR SCREENING: MRSA by PCR: NEGATIVE

## 2014-01-18 LAB — APTT: aPTT: 40 seconds — ABNORMAL HIGH (ref 24–37)

## 2014-01-18 MED ORDER — BENZONATATE 100 MG PO CAPS: 100.0000 mg | ORAL_CAPSULE | Freq: Three times a day (TID) | ORAL | Status: AC | PRN

## 2014-01-18 MED ORDER — SODIUM CHLORIDE 0.9 % IV SOLN
INTRAVENOUS | Status: DC
  Administered 2014-01-18: 12:00:00 via INTRAVENOUS

## 2014-01-18 MED ORDER — HEPARIN (PORCINE) IN NACL 100-0.45 UNIT/ML-% IJ SOLN
1000.0000 [IU]/h | INTRAMUSCULAR | Status: DC
  Administered 2014-01-18: 900 [IU]/h via INTRAVENOUS
  Filled 2014-01-18: qty 250

## 2014-01-18 MED ORDER — IOHEXOL 350 MG/ML SOLN
100.0000 mL | Freq: Once | INTRAVENOUS | Status: AC | PRN
  Administered 2014-01-18: 100 mL via INTRAVENOUS

## 2014-01-18 MED ORDER — POTASSIUM CHLORIDE CRYS ER 20 MEQ PO TBCR
40.0000 meq | EXTENDED_RELEASE_TABLET | Freq: Once | ORAL | Status: AC
  Administered 2014-01-18: 40 meq via ORAL
  Filled 2014-01-18: qty 2

## 2014-01-18 MED ORDER — HEPARIN BOLUS VIA INFUSION
2000.0000 [IU] | Freq: Once | INTRAVENOUS | Status: AC
  Administered 2014-01-18: 2000 [IU] via INTRAVENOUS
  Filled 2014-01-18: qty 2000

## 2014-01-18 MED ORDER — LEVOFLOXACIN 750 MG PO TABS: 750.0000 mg | ORAL_TABLET | Freq: Every day | ORAL | Status: AC

## 2014-01-18 NOTE — Progress Notes (Signed)
[facility] PROGRESS NOTE  Scherrie MerrittsWytony Ney LKG:[id number] DOB: 02/09/1980 DOA: 01/17/2014 PCP: No PCP Per Patient  Brief Narrative 33 year old healthy male presented to the ER with productive cough for past 1 week and chest pain worsened with deep inspiration and coughing. Patient found to have elevated troponin. Normal EKG. Chest x-ray showing right middle lobe pneumonia. Admitted to telemetry  Assessment/Plan: Community acquired pneumonia Continue empiric Levaquin. Flu PCR negative. HIV antibody negative. Urine for strep antigen negative. Follow Legionella antigen sputum culture. Supportive care with Tylenol and antitussives  Elevated troponin Possibly secondary to acute illness with pneumonia. History and exam unlikely for ACS. Continue aspirin. Place on IV heparin. 2-D echo pending. Troponin trending down. If 2-D echo normal discontinue heparin drip. Patient had elevated d-dimer. CT angiogram done on admission negative for PE  Hypokalemia Replenished  Diet: Heart healthy  DVT prophylaxis: IV heparin    Code Status: Full code Family Communication: None at bedside Disposition Plan: Tomorrow if stable   Consultants:  Cardiology  Procedures:  2-D echo  Antibiotics:  Levaquin  HPI/Subjective: Since seen and examined. He reports cough and breathing to be better. No chest pain  Objective: Filed Vitals:   01/18/14 0422  BP: 133/84  Pulse: 74  Temp: 98.5 F (36.9 C)  Resp: 20    Intake/Output Summary (Last 24 hours) at 01/18/14 1313 Last data filed at 01/18/14 1100  Gross per 24 hour  Intake   1220 ml  Output    600 ml  Net    620 ml   Filed Weights   01/17/14 2020 01/18/14 0422  Weight: 70.126 kg (154 lb 9.6 oz) 69.4 kg (153 lb)    Exam:   General:  Young male in no acute distress  HEENT: No pallor, moist oral mucosa  Chest: Clear to auscultation bilaterally, no added sounds  CVS: Normal S1 and S2, no murmurs  Abdomen: Soft, nondistended,  nontender,  Extremities: Warm, no edema    Data Reviewed: Basic Metabolic Panel:  Recent Labs Lab 01/17/14 1518 01/17/14 2143 01/18/14 0343  NA 133*  --  137  K 4.1  --  3.4*  CL 102  --  102  CO2 23  --  28  GLUCOSE 92  --  83  BUN 10  --  9  CREATININE 0.91 0.77 0.87  CALCIUM 8.9  --  8.6   Liver Function Tests:  Recent Labs Lab 01/17/14 1518 01/18/14 0343  AST 41* 27  ALT 17 19  ALKPHOS 52 51  BILITOT 2.3* 0.6  PROT 7.4 6.9  ALBUMIN 3.7 3.2*    Recent Labs Lab 01/17/14 1518  LIPASE 19   No results for input(s): AMMONIA in the last 168 hours. CBC:  Recent Labs Lab 01/17/14 1518 01/17/14 2143  WBC 8.6 6.6  NEUTROABS 4.3 2.8  HGB 13.7 12.0*  HCT 39.2 34.7*  MCV 88.9 87.8  PLT 238 264   Cardiac Enzymes:  Recent Labs Lab 01/17/14 1518 01/17/14 2143 01/18/14 0343 01/18/14 0940  TROPONINI 0.05* 0.19* 0.47* 0.38*   BNP (last 3 results) No results for input(s): PROBNP in the last 8760 hours. CBG: No results for input(s): GLUCAP in the last 168 hours.  Recent Results (from the past 240 hour(s))  MRSA PCR Screening     Status: None   Collection Time: 01/18/14  6:58 AM  Result Value Ref Range Status   MRSA by PCR NEGATIVE NEGATIVE Final    Comment:        The  GeneXpert MRSA Assay (FDA approved for NASAL specimens only), is one component of a comprehensive MRSA colonization surveillance program. It is not intended to diagnose MRSA infection nor to guide or monitor treatment for MRSA infections.      Studies: Dg Chest 2 View  01/17/2014   CLINICAL DATA:  Generalized body aches for 5 days. Chest pain. Smoker.  EXAM: CHEST  2 VIEW  COMPARISON:  None.  FINDINGS: Consolidation noted in the right middle lobe compatible with pneumonia. Left lung is clear. Heart is normal size. Mediastinal contours are within normal limits. No effusions. No acute bony abnormality.  IMPRESSION: Right middle lobe pneumonia.   Electronically Signed   By: Charlett NoseKevin   Dover M.D.   On: 01/17/2014 15:49   Ct Angio Chest Pe W/cm &/or Wo Cm  01/18/2014   CLINICAL DATA:  Chest pain and elevated D-dimer.  EXAM: CT ANGIOGRAPHY CHEST WITH CONTRAST  TECHNIQUE: Multidetector CT imaging of the chest was performed using the standard protocol during bolus administration of intravenous contrast. Multiplanar CT image reconstructions and MIPs were obtained to evaluate the vascular anatomy.  CONTRAST:  100mL OMNIPAQUE IOHEXOL 350 MG/ML SOLN  COMPARISON:  None.  FINDINGS: THORACIC INLET/BODY WALL:  No acute abnormality.  MEDIASTINUM:  Normal heart size. No pericardial effusion. No acute vascular abnormality, including pulmonary embolus. Mild enlargement of sub- carina and left peritracheal lymph nodes, presumably reactive to the pneumonia.  LUNG WINDOWS:  Ground-glass and consolidative opacification throughout in the right middle lobe. There is extensive contact with the pleura which could explain chest pain. No cavitation or effusion.  UPPER ABDOMEN:  No acute findings.  OSSEOUS:  No acute fracture.  No suspicious lytic or blastic lesions.  Review of the MIP images confirms the above findings.  IMPRESSION: 1. No pulmonary embolism. 2. Right middle lobe pneumonia.   Electronically Signed   By: Tiburcio PeaJonathan  Watts M.D.   On: 01/18/2014 01:53   [place] Abdomen Limited Ruq  01/18/2014   CLINICAL DATA:  Abnormal liver function tests.  EXAM: US ABDOMEN LIMITED - RIGHT UPPER QUADRANT  COMPARISON:  None.  FINDINGS: Gallbladder:  No gallstones or wall thickening visualized. No sonographic Murphy sign noted.  Common bile duct:  Diameter: 3 mm.  Where visualized, no filling defect.  Liver:  No focal lesion identified. Within normal limits in parenchymal echogenicity.  IMPRESSION: Normal right upper quadrant ultrasound.   Electronically Signed   By: Tiburcio PeaJonathan  Watts M.D.   On: 01/18/2014 04:18    Scheduled Meds: . aspirin  325 mg Oral Daily  . levofloxacin (LEVAQUIN) IV  750 mg Intravenous Q24H    Continuous Infusions: . sodium chloride 100 mL/hr at 01/18/14 1227  . heparin 1,000 Units/hr (01/18/14 1100)      Time spent: 25 minutes    Brett Darko  [facility] Pager [phone]. If 7PM-7AM, please contact night-coverage at [email or web address], password [email or web address] 01/18/2014, 1:13 PM  LOS: 1 day

## 2014-01-18 NOTE — Discharge Instructions (Signed)
Pneumonia °Pneumonia is an infection of the lungs.  °CAUSES °Pneumonia may be caused by bacteria or a virus. Usually, these infections are caused by breathing infectious particles into the lungs (respiratory tract). °SIGNS AND SYMPTOMS  °· Cough. °· Fever. °· Chest pain. °· Increased rate of breathing. °· Wheezing. °· Mucus production. °DIAGNOSIS  °If you have the common symptoms of pneumonia, your health care provider will typically confirm the diagnosis with a chest X-ray. The X-ray will show an abnormality in the lung (pulmonary infiltrate) if you have pneumonia. Other tests of your blood, urine, or sputum may be done to find the specific cause of your pneumonia. Your health care provider may also do tests (blood gases or pulse oximetry) to see how well your lungs are working. °TREATMENT  °Some forms of pneumonia may be spread to other people when you cough or sneeze. You may be asked to wear a mask before and during your exam. Pneumonia that is caused by bacteria is treated with antibiotic medicine. Pneumonia that is caused by the influenza virus may be treated with an antiviral medicine. Most other viral infections must run their course. These infections will not respond to antibiotics.  °HOME CARE INSTRUCTIONS  °· Cough suppressants may be used if you are losing too much rest. However, coughing protects you by clearing your lungs. You should avoid using cough suppressants if you can. °· Your health care provider may have prescribed medicine if he or she thinks your pneumonia is caused by bacteria or influenza. Finish your medicine even if you start to feel better. °· Your health care provider may also prescribe an expectorant. This loosens the mucus to be coughed up. °· Take medicines only as directed by your health care provider. °· Do not smoke. Smoking is a common cause of bronchitis and can contribute to pneumonia. If you are a smoker and continue to smoke, your cough may last several weeks after your  pneumonia has cleared. °· A cold steam vaporizer or humidifier in your room or home may help loosen mucus. °· Coughing is often worse at night. Sleeping in a semi-upright position in a recliner or using a couple pillows under your head will help with this. °· Get rest as you feel it is needed. Your body will usually let you know when you need to rest. °PREVENTION °A pneumococcal shot (vaccine) is available to prevent a common bacterial cause of pneumonia. This is usually suggested for: °· People over 65 years old. °· Patients on chemotherapy. °· People with chronic lung problems, such as bronchitis or emphysema. °· People with immune system problems. °If you are over 65 or have a high risk condition, you may receive the pneumococcal vaccine if you have not received it before. In some countries, a routine influenza vaccine is also recommended. This vaccine can help prevent some cases of pneumonia. You may be offered the influenza vaccine as part of your care. °If you smoke, it is time to quit. You may receive instructions on how to stop smoking. Your health care provider can provide medicines and counseling to help you quit. °SEEK MEDICAL CARE IF: °You have a fever. °SEEK IMMEDIATE MEDICAL CARE IF:  °· Your illness becomes worse. This is especially true if you are elderly or weakened from any other disease. °· You cannot control your cough with suppressants and are losing sleep. °· You begin coughing up blood. °· You develop pain which is getting worse or is uncontrolled with medicines. °· Any of the symptoms   which initially brought you in for treatment are getting worse rather than better. °· You develop shortness of breath or chest pain. °MAKE SURE YOU:  °· Understand these instructions. °· Will watch your condition. °· Will get help right away if you are not doing well or get worse. °Document Released: 01/05/2005 Document Revised: 05/22/2013 Document Reviewed: 03/27/2010 °ExitCare® Patient Information ©2015  [employer]. This information is not intended to replace advice given to you by your health care provider. Make sure you discuss any questions you have with your health care provider. ° ° °Emergency Department Resource Guide °1) Find a Doctor and Pay Out of Pocket °Although you won't have to find out who is covered by your insurance plan, it is a good idea to ask around and get recommendations. You will then need to call the office and see if the doctor you have chosen will accept you as a new patient and what types of options they offer for patients who are self-pay. Some doctors offer discounts or will set up payment plans for their patients who do not have insurance, but you will need to ask so you aren't surprised when you get to your appointment. ° °2) Contact Your Local Health Department °Not all health departments have doctors that can see patients for sick visits, but many do, so it is worth a call to see if yours does. If you don't know where your local health department is, you can check in your phone book. The CDC also has a tool to help you locate your state's health department, and many state websites also have listings of all of their local health departments. ° °3) Find a Walk-in Clinic °If your illness is not likely to be very severe or complicated, you may want to try a walk in clinic. These are popping up all over the country in pharmacies, drugstores, and shopping centers. They're usually staffed by nurse practitioners or physician assistants that have been trained to treat common illnesses and complaints. They're usually fairly quick and inexpensive. However, if you have serious medical issues or chronic medical problems, these are probably not your best option. ° °No Primary Care Doctor: °- Call [facility] at  832-8000 - they can help you locate a primary care doctor that  accepts your insurance, provides certain services, etc. °- Physician Referral Service- 1-800-533-3463 ° °Chronic Pain  Problems: °Organization         Address  Phone   Notes  °[facility]  (336) 297-2271 Patients need to be referred by their primary care doctor.  ° °Medication Assistance: °Organization         Address  Phone   Notes  °[place] [facility] [address] °[place], [place] [address] (336) 641-8030 --Must be a resident of [place] °-- Must have NO insurance coverage whatsoever (no Medicaid/ Medicare, etc.) °-- The pt. MUST have a primary care doctor that directs their care regularly and follows them in the community °  °[facility]  (866) 331-1348   °[employer]  (888) 892-1162   ° °Agencies that provide inexpensive medical care: °Organization         Address  Phone   Notes  °[facility]  (336) 832-8035   °[facility]    (336) 832-7272   °[facility] [address] °[place], [place] [address] (336) 832-4777   °[facility] [address], °[place] (336) 271-4999   °  [facility]    (336) 373-0678   °[facility]    (336) 272-1050   °[facility] ° [address], [place] Phone:  (336) 832-4444, Fax:  (336) 832-4440 Hours of Operation:  9 am - 6 pm, M-F.  Also accepts Medicaid/Medicare and self-pay.  °[facility] ° [address], [place] Phone: (336) 832-3150, Fax: (336) 832-3151. Hours of Operation:  8:30 am - 5:30 pm, M-F.  Also accepts Medicaid and self-pay.  °[facility] [place] [address], [place] Phone: (336) 878-6027   °[facility] [address], [place], [place] (336)723-1848, Ext. 123 Mondays & Thursdays: 7-9 AM.  First 15 patients are seen on a first come, first serve basis. °  ° °Medicaid-accepting [place] Providers: ° °Organization         Address  Phone   Notes  °[facility] [address], [place] (336) 641-2100 Also  accepts self-pay patients.  °[facility] [address], [place] ° (336) 856-9996   °[facility] [address], [place] (336) 288-8857   °[facility] [address] [place] [address], [place] (336) 299-7000   °Veita Bland [address], [place]  ° (336) 373-1557 Only accepts [place] Access Medicaid patients after they have their name applied to their card.  ° °Self-Pay (no insurance) in [place]: ° °Organization         Address  Phone   Notes  °Sickle Cell Patients, [facility] [address], [place] (336) 832-1970   °[facility] [address], [place] (336) 832-4400   °[facility] ° [address], [place] (336) 992-4800   °[facility]/Dr. Osei-Bonsu ° [address] [place] [address], [place] or [address], [place] (336) 841-8500 Phone number for both [place] and [place] locations is the same.  °[facility] [address], [place] (336) 299-0000   °[facility] [address] [place] [address], [place] or [address] (336) 852-7530 °(336) 878-2260   °[facility] [address], [place] (336) 350-1642, phone; (336) 294-5005, fax Sees patients 1st and 3rd Saturday of every month.  Must not qualify for public or private insurance (i.e. Medicaid, Medicare, [place] Health Choice, Veterans' Benefits) • Household income should be no more than 200% of the poverty level •The clinic cannot treat you if you are pregnant or think you are pregnant • Sexually transmitted diseases are not treated at the clinic.  ° ° °Dental Care: °Organization         Address  Phone  Notes  °[facility] [address],  (336) 641-6152 Accepts children up to age 21 who are enrolled in Medicaid or [place] Health Choice; pregnant  women with a Medicaid card; and children who have applied for Medicaid or [place] Health Choice, but were declined, whose parents can pay a reduced fee at time of service.  °[facility] [place]  [address], [place] (336) 641-7733 Accepts children up to age 21 who are enrolled in Medicaid or [place] Health Choice; pregnant women with a Medicaid card; and children who have applied for Medicaid or  Health Choice, but were declined,   whose parents can pay a reduced fee at time of service.  °[facility] ° [address], [place] (336) 641-4533 Patients are seen by appointment only. Walk-ins are not accepted. [facility] will see patients 18 years of age and older. °Monday - Tuesday (8am-5pm) °Most Wednesdays (8:30-5pm) °$30 per visit, cash only  °[facility] ° [address], [place] (336) 641-4533 Patients are seen by appointment only. Walk-ins are not accepted. [facility] will see patients 18 years of age and older. °One Wednesday Evening (Monthly: Volunteer Based).  $30 per visit, cash only  °[facility]  (919) 537-3737 for adults; Children under age 4, call Graduate Pediatric Dentistry at (919) [phone]. Children aged 4-14, please call (919) [phone] to request a pediatric application. ° Dental services are provided in all areas of dental care including fillings, crowns and bridges, complete and partial dentures, implants, gum treatment, root canals, and extractions. Preventive care is also provided. Treatment is provided to both adults and children. °Patients are selected via a lottery and there is often a waiting list. °  °[facility] [address], °[place] ° (336) 763-8833 [email or web address] °  °[facility] [address], [place], [place] (336)723-1848, Ext. 123 Second and Fourth Thursday of each month, opens at 6:30 AM; Clinic ends at 9 AM.  Patients are  seen on a first-come first-served basis, and a limited number are seen during each clinic.  ° °[facility] ° [address], [place], [place] (336) 723-7904   Eligibility Requirements °You must have lived in [place], [place], or [place] counties for at least the last three months. °  You cannot be eligible for state or federal sponsored healthcare insurance, including Veterans Administration, Medicaid, or Medicare. °  You generally cannot be eligible for healthcare insurance through your employer.  °  How to apply: °Eligibility screenings are held every Tuesday and Wednesday afternoon from 1:00 pm until 4:00 pm. You do not need an appointment for the interview!  °[facility] [address], [place], [place] 336-631-2330   °[facility]  336-342-8273   °[facility]  336-703-3100   °[facility]  336-570-6415   ° °Behavioral Health Resources in the Community: °Intensive Outpatient Programs °Organization         Address  Phone  Notes  °[facility] [address], [place], [place] 336-878-6098   °[facility] [address], [place], [place] 336-832-9800   °[facility] [address], [place], [place] ° 336-882-2125   °[facility] [address],  °[place],  1-800-853-5163 or 336-641-4981   °Substance Abuse Resources °Organization         Address  Phone  Notes  °[facility]  336-882-2125   °[facility]  336-784-9470   °[facility]  336-285-9073   °[facility]  336-845-3988   °Residential & Outpatient Substance Abuse Program  1-800-659-3381   °Psychological Services °Organization         Address  Phone  Notes  °[facility]  336- 832-9600   °[facility]  336- 378-7881   °[facility] [address], [place] 1-800-853-5163 or 336-641-4981   ° °Mobile Crisis  Teams °Organization         Address  Phone  Notes  °[facility]   Crisis Care Unit  1-877-626-1772   °Assertive °[facility] ° [address] [place], [place] 336-834-9664   °Sharon DeEsch [address] °[place] 336-554-5454   ° °Self-Help/Support Groups °Organization         Address  Phone             Notes  °[facility] - variety of support groups  336- 373-1402 Call for more information  °Narcotics Anonymous (NA), [facility] [address], °[place]  2 meetings at this location  ° °Residential Treatment Programs °Organization         Address  Phone  Notes  °[facility] [address],    °[place]  1-866-801-8205   °[facility] ° [address], [place], [place] 704-293-8524   °[facility] [address], [place] 336-845-3988 Admissions: 8am-3pm M-F  °[facility] [address]    °[place], [place] 336-841-1104   °[facility] [address], [place], [place] 336-379-7146   °[facility] [address]  °[place] 336-285-9073   °[facility] - Intensive Outpatient [address], [place], [place] 336-852-3033   °[facility] [address]  °[place], [place] 1-877-615-2722 or 336-784-9470   °[facility] [address] [place], [place] 336-227-7417 Accepts Medicaid  °[facility] [address]  °[place] 1-800-659-3381 Substance Abuse/Addiction Treatment  ° °[place] Behavioral Health Resources °Organization         Address  Phone  Notes  °[facility]  (888) 581-9988   °Julie Brannon, PhD [address] [place], [place]   (336) 349-5553 or (336) 951-0000   °[facility]   [address] °[place], [place] (336) 349-4454   °[facility] [address], [place], [place] (336) 342-8316  Insurance/Medicaid/sponsorship through [facility]  °[facility] [address]                                    [place], [place] (336) 342-8316 Therapy/tele-psych/case  °[facility] [address]  ° [place], [place] (336) 349-2233    °Dr. Arfeen  (336) 349-4544   °[facility] [place]  [employer] [place] [facility] 1) [address], [place] °2) [address], [place] °3)  [address], [place] (336) 349-3220 °(336) 342-7768 ° °(336) 342-8140   °[place] [facility] (336) 342-1394 or (336) 342-3537 (After Hours)    ° ° ° °

## 2014-01-18 NOTE — H&P (Signed)
[facility] History and Physical  [name] [name] WUJ:[id number] DOB: 04/26/1980 DOA: 01/17/2014  Referring physician: ER physician. PCP: No PCP Per Patient   Chief Complaint: Chest pain.  HPI: [name] [name] is a 33 y.o. male with no significant past medical history presents to the ER because of chest pain. Patient also has been having productive cough for last 1 week. Patient's chest pain is usually only on deep coughing and deep inspiration. Patient also has been having productive cough which at times is yellowish in color. Denies any hemoptysis. Denies any recent travel or sick contacts. In the ER chest x-ray showed pneumonic process. Patient's troponin is mildly elevated EKG was unremarkable. On-call cardiologist was consulted and this time patient has been admitted for further management. Patient stays at a couple of times he also had nausea vomiting which usually results from his cough. Abdomen appears benign on exam. Denies any diarrhea.   Review of Systems: As presented in the history of presenting illness, rest negative.  Past Medical History  Diagnosis Date  . Medical history non-contributory    Past Surgical History  Procedure Laterality Date  . No past surgeries     Social History:  reports that he has been smoking Cigarettes.  He has a 3.25 pack-year smoking history. He has never used smokeless tobacco. He reports that he drinks alcohol. He reports that he does not use illicit drugs. Where does patient live home. Can patient participate in ADLs? Yes.  No Known Allergies  Family History:  Family History  Problem Relation Age of Onset  . Diabetes Mellitus II Father       Prior to Admission medications   Medication Sig Start Date End Date Taking? Authorizing Provider  cyclobenzaprine (FLEXERIL) 10 MG tablet Take 1 tablet (10 mg total) by mouth 2 (two) times daily as needed for muscle spasms. 10/01/13  Yes Arthor CaptainAbigail Harris, PA-C  ibuprofen (ADVIL,MOTRIN) 200 MG tablet Take  400 mg by mouth every 6 (six) hours as needed (migraine).   Yes Historical Provider, MD  HYDROcodone-acetaminophen (NORCO) 5-325 MG per tablet Take 2 tablets by mouth every 4 (four) hours as needed. 10/01/13   Arthor CaptainAbigail Harris, PA-C  naproxen (NAPROSYN) 500 MG tablet Take 1 tablet (500 mg total) by mouth 2 (two) times daily with a meal. 10/01/13   Arthor CaptainAbigail Harris, PA-C    Physical Exam: Filed Vitals:   01/17/14 1925 01/17/14 1940 01/17/14 1953 01/17/14 2020  BP: 140/86 127/72  132/81  Pulse: 72 65  66  Temp:    98.4 F (36.9 C)  TempSrc:    Oral  Resp: 16 0 16 20  Height:    5\' 10"  (1.778 m)  Weight:    70.126 kg (154 lb 9.6 oz)  SpO2: 99% 100%  100%     General:  Well-developed and nourished.  Eyes: Anicteric no pallor.  ENT: No discharge from the ears eyes nose and mouth.  Neck: No mass felt.  Cardiovascular: S1-S2 heard.  Respiratory: No rhonchi or crepitations.  Abdomen: Soft nontender bowel sounds present.  Skin: No rash.  Musculoskeletal: No edema.  Psychiatric: Appears normal.  Neurologic: Alert awake oriented to time place and person. Moves all extremities.  Labs on Admission:  Basic Metabolic Panel:  Recent Labs Lab 01/17/14 1518 01/17/14 2143  NA 133*  --   K 4.1  --   CL 102  --   CO2 23  --   GLUCOSE 92  --   BUN 10  --  CREATININE 0.91 0.77  CALCIUM 8.9  --    Liver Function Tests:  Recent Labs Lab 01/17/14 1518  AST 41*  ALT 17  ALKPHOS 52  BILITOT 2.3*  PROT 7.4  ALBUMIN 3.7    Recent Labs Lab 01/17/14 1518  LIPASE 19   No results for input(s): AMMONIA in the last 168 hours. CBC:  Recent Labs Lab 01/17/14 1518 01/17/14 2143  WBC 8.6 6.6  NEUTROABS 4.3 2.8  HGB 13.7 12.0*  HCT 39.2 34.7*  MCV 88.9 87.8  PLT 238 264   Cardiac Enzymes:  Recent Labs Lab 01/17/14 1518 01/17/14 2143  TROPONINI 0.05* 0.19*    BNP (last 3 results) No results for input(s): PROBNP in the last 8760 hours. CBG: No results for  input(s): GLUCAP in the last 168 hours.  Radiological Exams on Admission: Dg Chest 2 View  01/17/2014   CLINICAL DATA:  Generalized body aches for 5 days. Chest pain. Smoker.  EXAM: CHEST  2 VIEW  COMPARISON:  None.  FINDINGS: Consolidation noted in the right middle lobe compatible with pneumonia. Left lung is clear. Heart is normal size. Mediastinal contours are within normal limits. No effusions. No acute bony abnormality.  IMPRESSION: Right middle lobe pneumonia.   Electronically Signed   By: Charlett Nose M.D.   On: 01/17/2014 15:49   Ct Angio Chest Pe W/cm &/or Wo Cm  01/18/2014   CLINICAL DATA:  Chest pain and elevated D-dimer.  EXAM: CT ANGIOGRAPHY CHEST WITH CONTRAST  TECHNIQUE: Multidetector CT imaging of the chest was performed using the standard protocol during bolus administration of intravenous contrast. Multiplanar CT image reconstructions and MIPs were obtained to evaluate the vascular anatomy.  CONTRAST:  OMNIPAQUE IOHEXOL 350 MG/ML SOLN  COMPARISON:  None.  FINDINGS: THORACIC INLET/BODY WALL:  No acute abnormality.  MEDIASTINUM:  Normal heart size. No pericardial effusion. No acute vascular abnormality, including pulmonary embolus. Mild enlargement of sub- carina and left peritracheal lymph nodes, presumably reactive to the pneumonia.  LUNG WINDOWS:  Ground-glass and consolidative opacification throughout in the right middle lobe. There is extensive contact with the pleura which could explain chest pain. No cavitation or effusion.  UPPER ABDOMEN:  No acute findings.  OSSEOUS:  No acute fracture.  No suspicious lytic or blastic lesions.  Review of the MIP images confirms the above findings.  IMPRESSION: 1. No pulmonary embolism. 2. Right middle lobe pneumonia.   Electronically Signed   By: Tiburcio Pea M.D.   On: 01/18/2014 01:53    EKG: Independently reviewed. Normal sinus rhythm.  Assessment/Plan Principal Problem:   CAP (community acquired pneumonia) Active Problems:    Elevated troponin   Chest pain   Pneumonia   Abnormal LFTs   Pain in the chest   1. Community acquired pneumonia - patient has been placed on Levaquin. Check influenza PCR urine for strep and legionella antigen. Check HIV status. 2. Pleuritic-type of chest pain with elevated troponin - cardiologist on-call has been notified and they will be seeing patient in consult. At this time we will cycle cardiac markers and if patient's markers tend to show increasing trend and we will place patient on heparin. Patient is on aspirin. Check d-dimer. If positive we will get CT angiogram of the chest. When necessary pain medications. Patient's EKG does not show any definite signs of pericarditis. 3. Elevated LFTs - patient had mildly elevated LFTs with mildly elevated bilirubin. Check acute hepatitis panel and a sonogram of the abdomen since patient also  had nausea vomiting. 4. Tobacco abuse - patient advised to quit smoking.   DVT Prophylaxis Lovenox.  Code Status: Full code.  Family Communication: None.  Disposition Plan: Admit to inpatient.    KAKRAKANDY,ARSHAD N. [facility] Pager [phone].  If 7PM-7AM, please contact night-coverage [email or web address] Password [email or web address] 01/18/2014, 2:55 AM

## 2014-01-18 NOTE — Progress Notes (Signed)
ANTICOAGULATION CONSULT NOTE - Initial Consult  Pharmacy Consult for IV Heparin Indication: VTE treatment (d-dimer and troponin elevated)  No Known Allergies  Patient Measurements: Height: 5\' 10"  (177.8 cm) Weight: 154 lb 9.6 oz (70.126 kg) IBW/kg (Calculated) : 73 Heparin Dosing Weight: 70 kg  Vital Signs: Temp: 98.4 F (36.9 C) (12/30 2020) Temp Source: Oral (12/30 2020) BP: 132/81 mmHg (12/30 2020) Pulse Rate: 66 (12/30 2020)  Labs:  Recent Labs  01/17/14 1518 01/17/14 2143  HGB 13.7 12.0*  HCT 39.2 34.7*  PLT 238 264  CREATININE 0.91 0.77  TROPONINI 0.05* 0.19*    Estimated Creatinine Clearance: 129 mL/min (by C-G formula based on Cr of 0.77).   Medical History: Past Medical History  Diagnosis Date  . Medical history non-contributory     Medications:  Scheduled:  . aspirin  325 mg Oral Daily  . heparin  2,000 Units Intravenous Once  . levofloxacin (LEVAQUIN) IV  750 mg Intravenous Q24H   Infusions:  . sodium chloride    . heparin      Assessment: [age] yoM admitted with CP.  Levaquin started for CAP. Now with elevated troponin and d-dimer.  IV Heparin per Rx for VTE treatment.  Goal of Therapy:  Heparin level 0.3-0.7 units/ml Monitor platelets by anticoagulation protocol: Yes   Plan:   Baseline coags stat  Heparin 2000 unit bolus IV x1 (pt received Lovenox 40mg  @ ~2300)  Start heparin drip @ 900 units/hr  Check HL/CBC daily  Check 1st HL in 6 hours  Susanne GreenhouseGreen, Jezebel Pollet R 01/18/2014,1:26 AM

## 2014-01-18 NOTE — Progress Notes (Signed)
CRITICAL VALUE ALERT  Critical value received: 0.47 Troponin  Date of notification: 01/18/14  Time of notification:  0645  Critical value read back:yes  Nurse who received alert:  Karie SchwalbeJoanne Althea Backs  MD notified (1st page): yes  Time of first page: [phone]

## 2014-01-18 NOTE — Discharge Summary (Signed)
Physician Discharge Summary  [name] DOB: 05/23/1980 DOA: 01/17/2014  PCP: No PCP Per Patient  Admit date: 01/17/2014 Discharge date: 01/18/2014  Time spent: 30 minutes  Recommendations for Outpatient Follow-up:  Discharge home on 4 more days of oral levaquin to complete 5 days of abx  Discharge Diagnoses:  Principal Problem:   CAP (community acquired pneumonia)   Active Problems:   Elevated troponin   Chest pain   Tobacco abuse   Discharge Condition: fair  Diet recommendation: regular  Filed Weights   01/17/14 2020 01/18/14 0422  Weight: 70.126 kg (154 lb 9.6 oz) 69.4 kg (153 lb)    History of present illness:  33 y.o. male with no significant past medical history presented to the ER due to chest pain. Patient also  having productive cough for last 1 week.  chest pain on coughing and deep inspiration.Denied any hemoptysis. Denied any recent travel or sick contacts. In the ER chest x-ray showed pneumonic process. Patient's troponin is mildly elevated . EKG was unremarkable.  D-dimer was mildly elevated. CT angiogram of the chest done negative for PE and showed right middle lobe pneumonia Patient admitted for community-acquired pneumonia.  Hospital Course:  Community acquired pneumonia Patient placed on empiric Levaquin. HIV antibody was negative. Flu PCR was negative. HIV antibody was negative. Strep urine antigen was negative. Legionella antigen sent and results pending. Patient remains afebrile. Spent has resolved and cough has significantly improved. -Patient wishes to go home and can be discharged home on oral Levaquin for 4 more days to complete a 5 day course.  Elevated troponin Troponin peaked to 0.47. No further chest discomfort he was placed on IV heparin drip and full dose aspirin. This is likely in the setting of underlying pneumonia. EKG unremarkable. Seen by cardiology and consider this to be unlikely of ACS. Her drug screen positive for  cannabis. 2-D echo done showing normal EF and no wall motion abnormality. No further cardiac workup needed.  Tobacco use Counseled on cessation  Mildly elevated AST Ultrasound abdomen done and was unremarkable   Patient wishing to go home. Since workup have been negative discontinue heparin drip and discharge him on antibiotics and antitussives. Provided resource to obtain outpatient PCP.  Procedures:  2-D echo --EF 50-55%. Normal wall motion  Consultations:  Cardiology  Discharge Exam: Filed Vitals:   01/18/14 1420  BP: 125/69  Pulse: 70  Temp: 98.1 F (36.7 C)  Resp: 18      General: Young male in no acute distress  HEENT: No pallor, moist oral mucosa  Chest: Clear to auscultation bilaterally, no added sounds  CVS: Normal S1 and S2, no murmurs  Abdomen: Soft, nondistended, nontender,  Extremities: Warm, no edema   Discharge Instructions    Current Discharge Medication List    START taking these medications   Details  benzonatate (TESSALON) 100 MG capsule Take 1 capsule (100 mg total) by mouth 3 (three) times daily as needed for cough. Qty: 20 capsule, Refills: 0    levofloxacin (LEVAQUIN) 750 MG tablet Take 1 tablet (750 mg total) by mouth daily. Qty: 4 tablet, Refills: 0      STOP taking these medications     cyclobenzaprine (FLEXERIL) 10 MG tablet      ibuprofen (ADVIL,MOTRIN) 200 MG tablet      HYDROcodone-acetaminophen (NORCO) 5-325 MG per tablet      naproxen (NAPROSYN) 500 MG tablet        No Known Allergies Follow-up Information  Follow up with Refer to resource guide to help find primary care doctor..       The results of significant diagnostics from this hospitalization (including imaging, microbiology, ancillary and laboratory) are listed below for reference.    Significant Diagnostic Studies: Dg Chest 2 View  01/17/2014   CLINICAL DATA:  Generalized body aches for 5 days. Chest pain. Smoker.  EXAM: CHEST  2 VIEW   COMPARISON:  None.  FINDINGS: Consolidation noted in the right middle lobe compatible with pneumonia. Left lung is clear. Heart is normal size. Mediastinal contours are within normal limits. No effusions. No acute bony abnormality.  IMPRESSION: Right middle lobe pneumonia.   Electronically Signed   By: Charlett Nose M.D.   On: 01/17/2014 15:49   Ct Angio Chest Pe W/cm &/or Wo Cm  01/18/2014   CLINICAL DATA:  Chest pain and elevated D-dimer.  EXAM: CT ANGIOGRAPHY CHEST WITH CONTRAST  TECHNIQUE: Multidetector CT imaging of the chest was performed using the standard protocol during bolus administration of intravenous contrast. Multiplanar CT image reconstructions and MIPs were obtained to evaluate the vascular anatomy.  CONTRAST:  OMNIPAQUE IOHEXOL 350 MG/ML SOLN  COMPARISON:  None.  FINDINGS: THORACIC INLET/BODY WALL:  No acute abnormality.  MEDIASTINUM:  Normal heart size. No pericardial effusion. No acute vascular abnormality, including pulmonary embolus. Mild enlargement of sub- carina and left peritracheal lymph nodes, presumably reactive to the pneumonia.  LUNG WINDOWS:  Ground-glass and consolidative opacification throughout in the right middle lobe. There is extensive contact with the pleura which could explain chest pain. No cavitation or effusion.  UPPER ABDOMEN:  No acute findings.  OSSEOUS:  No acute fracture.  No suspicious lytic or blastic lesions.  Review of the MIP images confirms the above findings.  IMPRESSION: 1. No pulmonary embolism. 2. Right middle lobe pneumonia.   Electronically Signed   By: Tiburcio Pea M.D.   On: 01/18/2014 01:53   US Abdomen Limited Ruq  01/18/2014   CLINICAL DATA:  Abnormal liver function tests.  EXAM: US ABDOMEN LIMITED - RIGHT UPPER QUADRANT  COMPARISON:  None.  FINDINGS: Gallbladder:  No gallstones or wall thickening visualized. No sonographic Murphy sign noted.  Common bile duct:  Diameter: 3 mm.  Where visualized, no filling defect.  Liver:  No focal  lesion identified. Within normal limits in parenchymal echogenicity.  IMPRESSION: Normal right upper quadrant ultrasound.   Electronically Signed   By: Tiburcio Pea M.D.   On: 01/18/2014 04:18    Microbiology: Recent Results (from the past 240 hour(s))  MRSA PCR Screening     Status: None   Collection Time: 01/18/14  6:58 AM  Result Value Ref Range Status   MRSA by PCR NEGATIVE NEGATIVE Final    Comment:        The GeneXpert MRSA Assay (FDA approved for NASAL specimens only), is one component of a comprehensive MRSA colonization surveillance program. It is not intended to diagnose MRSA infection nor to guide or monitor treatment for MRSA infections.      Labs: Basic Metabolic Panel:  Recent Labs Lab 01/17/14 1518 01/17/14 2143 01/18/14 0343  NA 133*  --  137  K 4.1  --  3.4*  CL 102  --  102  CO2 23  --  28  GLUCOSE 92  --  83  BUN 10  --  9  CREATININE 0.91 0.77 0.87  CALCIUM 8.9  --  8.6   Liver Function Tests:  Recent Labs Lab  01/17/14 1518 01/18/14 0343  AST 41* 27  ALT 17 19  ALKPHOS 52 51  BILITOT 2.3* 0.6  PROT 7.4 6.9  ALBUMIN 3.7 3.2*    Recent Labs Lab 01/17/14 1518  LIPASE 19   No results for input(s): AMMONIA in the last 168 hours. CBC:  Recent Labs Lab 01/17/14 1518 01/17/14 2143 01/18/14 1725  WBC 8.6 6.6 5.7  NEUTROABS 4.3 2.8  --   HGB 13.7 12.0* 12.3*  HCT 39.2 34.7* 35.6*  MCV 88.9 87.8 87.9  PLT 238 264 302   Cardiac Enzymes:  Recent Labs Lab 01/17/14 1518 01/17/14 2143 01/18/14 0343 01/18/14 0940  TROPONINI 0.05* 0.19* 0.47* 0.38*   BNP: BNP (last 3 results) No results for input(s): PROBNP in the last 8760 hours. CBG: No results for input(s): GLUCAP in the last 168 hours.     SignedEddie North:  [name] [name]  [facility] 01/18/2014, 6:05 PM

## 2014-01-18 NOTE — Progress Notes (Signed)
ANTICOAGULATION CONSULT NOTE   Pharmacy Consult for IV Heparin Indication: r/o ACS  No Known Allergies  Patient Measurements: Height: 5\' 10"  (177.8 cm) Weight: 153 lb (69.4 kg) IBW/kg (Calculated) : 73 Heparin Dosing Weight: 70 kg  Vital Signs: Temp: 98.5 F (36.9 C) (12/31 0422) Temp Source: Oral (12/31 0422) BP: 133/84 mmHg (12/31 0422) Pulse Rate: 74 (12/31 0422)  Labs:  Recent Labs  01/17/14 1518 01/17/14 2143 01/18/14 0343 01/18/14 0940  HGB 13.7 12.0*  --   --   HCT 39.2 34.7*  --   --   PLT 238 264  --   --   APTT  --   --  40*  --   LABPROT  --   --  15.5*  --   INR  --   --  1.22  --   HEPARINUNFRC  --   --   --  0.35  CREATININE 0.91 0.77 0.87  --   TROPONINI 0.05* 0.19* 0.47*  --     Estimated Creatinine Clearance: 117.4 mL/min (by C-G formula based on Cr of 0.87).   Assessment: [age] yoM admitted with Chest pain. CTA negative for PE but revealed infiltrate.   Levaquin started for CAP. Now with elevated troponin and trending up.  IV Heparin per Rx for rule out ACS. Cardiology to see patient.   Today, 01/18/2014:  Initial heparin level = 0.35 on heparin 900 units/hr (heparin bolus 2000 units given)  CBC: Hgb = 12, pltc WNL  Baseline INR = 1.22, aPTT = 40sec  On ASA 325mg  for r/o ACS  Goal of Therapy:  Heparin level 0.3-0.7 units/ml Monitor platelets by anticoagulation protocol: Yes   Plan:   Heparin level is low-end therapeutic range but suspect this is effect from bolus and anticipate level will decrease, thus increase heparin gtt to 1000 units/hr  Check 6h heparin level (check CBC at same time since not ordered this am)  Check HL/CBC daily  Monitor for bleeding  Juliette Alcideustin Taia Bramlett, PharmD, BCPS.   Pager: [phone]  01/18/2014,10:17 AM

## 2014-01-18 NOTE — Progress Notes (Signed)
Echocardiogram 2D Echocardiogram has been performed.  [name] [name] 01/18/2014, 2:39 PM

## 2014-01-18 NOTE — Consult Note (Signed)
CARDIOLOGY CONSULT NOTE   Patient ID: [name] [name] MRN: [id number], DOB/AGE: 34/08/1980   Admit date: 01/17/2014 Date of Consult: 01/18/2014   Primary Physician: No PCP Per Patient Primary Cardiologist: None  Pt. Profile  33 year old gentleman without prior history of heart problems admitted with right middle lobe pneumonia.  Troponin mildly elevated.  Problem List  Past Medical History  Diagnosis Date  . Medical history non-contributory     Past Surgical History  Procedure Laterality Date  . No past surgeries       Allergies  No Known Allergies  HPI   This 33 year old African-American male with no prior significant medical history was admitted because of chest pain.  The pain is worse with coughing and deep breathing.  He has had a productive cough and has been ill for about a week.  Chest x-ray and CT angiogram showed right middle lobe pneumonia.  He does not have any history of exertional chest discomfort to suggest ischemic heart disease.  Inpatient Medications  . aspirin  325 mg Oral Daily  . levofloxacin (LEVAQUIN) IV  750 mg Intravenous Q24H    Family History Family History  Problem Relation Age of Onset  . Diabetes Mellitus II Father      Social History History   Social History  . Marital Status: Single    Spouse Name: N/A    Number of Children: N/A  . Years of Education: N/A   Occupational History  . Not on file.   Social History Main Topics  . Smoking status: Current Every Day Smoker -- 0.25 packs/day for 13 years    Types: Cigarettes  . Smokeless tobacco: Never Used  . Alcohol Use: Yes     Comment: "occasionally"  . Drug Use: No  . Sexual Activity: Not on file   Other Topics Concern  . Not on file   Social History Narrative     Review of Systems  General:  No chills, fever, night sweats or weight changes.  Cardiovascular:  No chest pain, dyspnea on exertion, edema, orthopnea, palpitations, paroxysmal nocturnal  dyspnea. Dermatological: No rash, lesions/masses Respiratory: No cough, dyspnea Urologic: No hematuria, dysuria Abdominal:   No nausea, vomiting, diarrhea, bright red blood per rectum, melena, or hematemesis Neurologic:  No visual changes, wkns, changes in mental status. All other systems reviewed and are otherwise negative except as noted above.  Physical Exam  Blood pressure 133/84, pulse 74, temperature 98.5 F (36.9 C), temperature source Oral, resp. rate 20, height 5\' 10"  (1.778 m), weight 153 lb (69.4 kg), SpO2 100 %.  General: Pleasant, NAD Psych: Normal affect. Neuro: Alert and oriented X 3. Moves all extremities spontaneously. HEENT: Normal  Neck: Supple without bruits or JVD. Lungs:  Resp regular and unlabored, CTA. Heart: RRR no s3, s4, or murmurs.  No pericardial rub. Abdomen: Soft, non-tender, non-distended, BS + x 4.  Extremities: No clubbing, cyanosis or edema. DP/PT/Radials 2+ and equal bilaterally.  Labs   Recent Labs  01/17/14 1518 01/17/14 2143 01/18/14 0343  TROPONINI 0.05* 0.19* 0.47*   Lab Results  Component Value Date   WBC 6.6 01/17/2014   HGB 12.0* 01/17/2014   HCT 34.7* 01/17/2014   MCV 87.8 01/17/2014   PLT 264 01/17/2014     Recent Labs Lab 01/18/14 0343  NA 137  K 3.4*  CL 102  CO2 28  BUN 9  CREATININE 0.87  CALCIUM 8.6  PROT 6.9  BILITOT 0.6  ALKPHOS 51  ALT 19  AST 27  GLUCOSE 83   No results found for: CHOL, HDL, LDLCALC, TRIG Lab Results  Component Value Date   DDIMER 0.74* 01/17/2014    Radiology/Studies  Dg Chest 2 View  01/17/2014   CLINICAL DATA:  Generalized body aches for 5 days. Chest pain. Smoker.  EXAM: CHEST  2 VIEW  COMPARISON:  None.  FINDINGS: Consolidation noted in the right middle lobe compatible with pneumonia. Left lung is clear. Heart is normal size. Mediastinal contours are within normal limits. No effusions. No acute bony abnormality.  IMPRESSION: Right middle lobe pneumonia.   Electronically  Signed   By: Charlett NoseKevin  Dover M.D.   On: 01/17/2014 15:49   Ct Angio Chest Pe W/cm &/or Wo Cm  01/18/2014   CLINICAL DATA:  Chest pain and elevated D-dimer.  EXAM: CT ANGIOGRAPHY CHEST WITH CONTRAST  TECHNIQUE: Multidetector CT imaging of the chest was performed using the standard protocol during bolus administration of intravenous contrast. Multiplanar CT image reconstructions and MIPs were obtained to evaluate the vascular anatomy.  CONTRAST:  100mL OMNIPAQUE IOHEXOL 350 MG/ML SOLN  COMPARISON:  None.  FINDINGS: THORACIC INLET/BODY WALL:  No acute abnormality.  MEDIASTINUM:  Normal heart size. No pericardial effusion. No acute vascular abnormality, including pulmonary embolus. Mild enlargement of sub- carina and left peritracheal lymph nodes, presumably reactive to the pneumonia.  LUNG WINDOWS:  Ground-glass and consolidative opacification throughout in the right middle lobe. There is extensive contact with the pleura which could explain chest pain. No cavitation or effusion.  UPPER ABDOMEN:  No acute findings.  OSSEOUS:  No acute fracture.  No suspicious lytic or blastic lesions.  Review of the MIP images confirms the above findings.  IMPRESSION: 1. No pulmonary embolism. 2. Right middle lobe pneumonia.   Electronically Signed   By: Tiburcio PeaJonathan  Watts M.D.   On: 01/18/2014 01:53   [place] Abdomen Limited Ruq  01/18/2014   CLINICAL DATA:  Abnormal liver function tests.  EXAM: US ABDOMEN LIMITED - RIGHT UPPER QUADRANT  COMPARISON:  None.  FINDINGS: Gallbladder:  No gallstones or wall thickening visualized. No sonographic Murphy sign noted.  Common bile duct:  Diameter: 3 mm.  Where visualized, no filling defect.  Liver:  No focal lesion identified. Within normal limits in parenchymal echogenicity.  IMPRESSION: Normal right upper quadrant ultrasound.   Electronically Signed   By: Tiburcio PeaJonathan  Watts M.D.   On: 01/18/2014 04:18    ECG  EKG on admission showed no ischemic changes.  I have ordered a follow-up EKG for  today  ASSESSMENT AND PLAN  1.  Right middle lobe pneumonia 2.  Elevated troponin probably secondary to acute illness of pneumonia.  I doubt ACS.  I do not hear any pericardial rub.  Plan: Continue to cycle cardiac enzymes.  Continue aspirin.  He is currently on IV heparin.  Continue IV heparin for now.  Two-dimensional echocardiogram is pending.  Repeat EKG is pending Signed, Cassell Clementhomas Nusaiba Guallpa, MD  01/18/2014, 10:25 AM

## 2014-01-21 LAB — LEGIONELLA ANTIGEN, URINE

## 2014-01-22 ENCOUNTER — Encounter: Payer: Self-pay | Admitting: Internal Medicine

## 2016-04-07 IMAGING — CT CT ANGIO CHEST
2 of 12 series · 17 of 38 positions shown · IV contrast (OMNIPAQUE)
Comparison: None.

CLINICAL DATA: Chest pain and elevated D-dimer.

EXAM:
CT ANGIOGRAPHY CHEST WITH CONTRAST
TECHNIQUE: Multidetector CT imaging of the chest was performed using the
standard protocol during bolus administration of intravenous
contrast. Multiplanar CT image reconstructions and MIPs were
obtained to evaluate the vascular anatomy.
CONTRAST:  100mL OMNIPAQUE IOHEXOL 350 MG/ML SOLN

[Series 6: pe thins @ 1mm · axial · 0.66mm/px · z∈[+1197,+1411]mm · 15 of 246 slices shown (1 of 2)]
[im 16/246  lung]
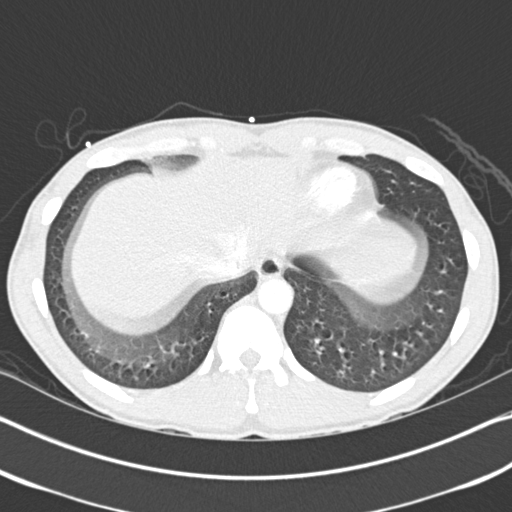
[im 31/246  mediastinal]
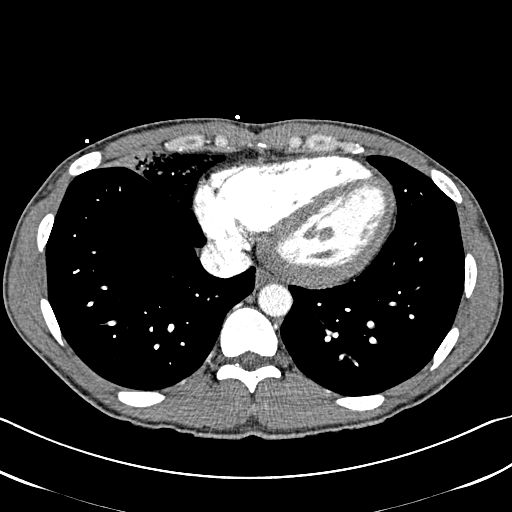
[im 46/246  lung]
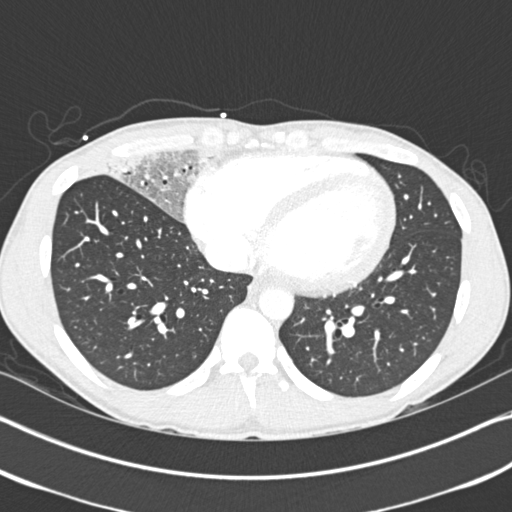
[im 62/246  mediastinal]
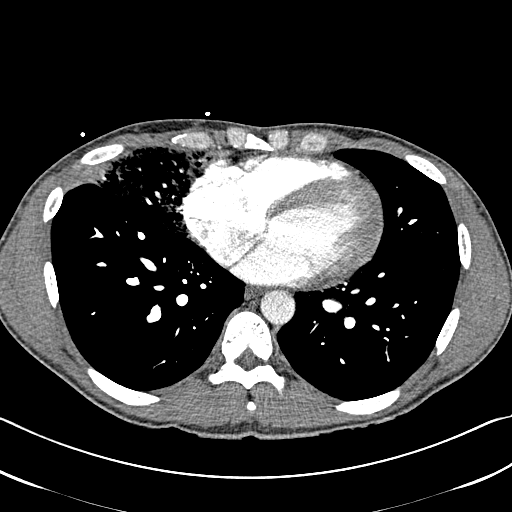
[im 77/246  lung]
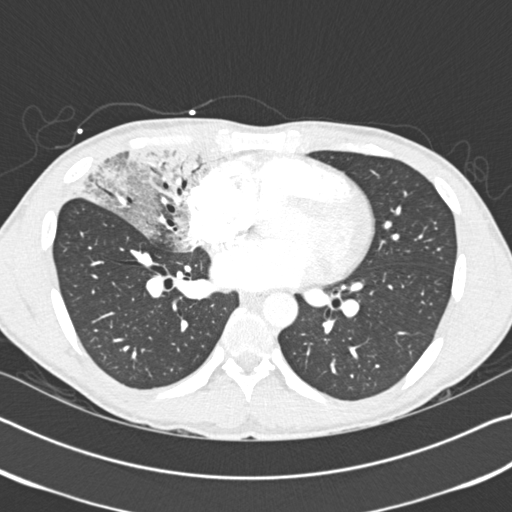
[im 92/246  mediastinal]
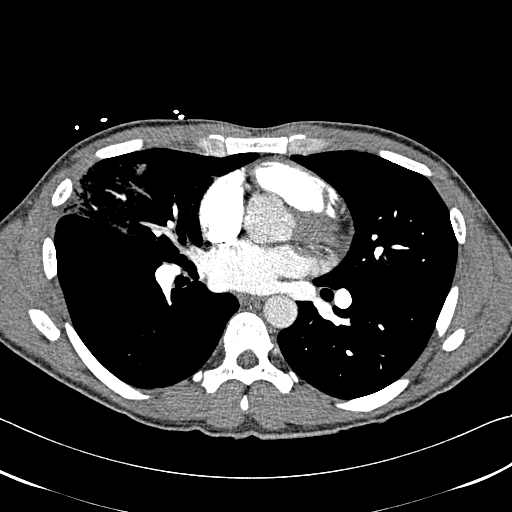
[im 108/246  lung]
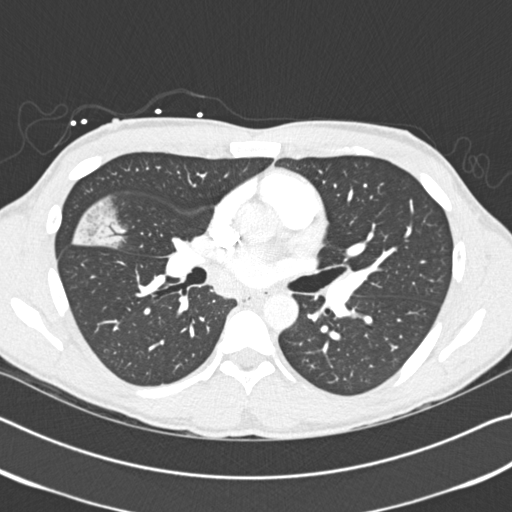
[im 123/246  mediastinal]
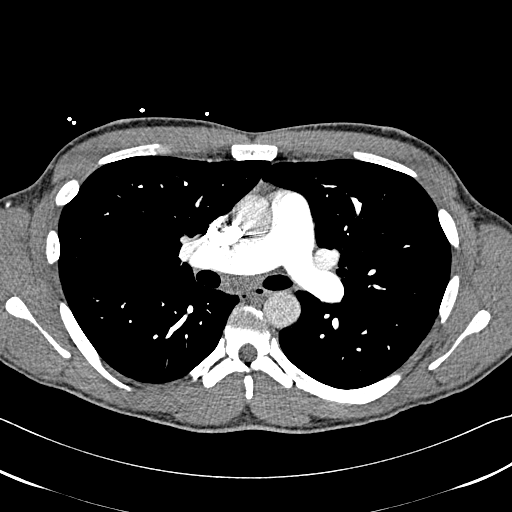
[im 138/246  lung]
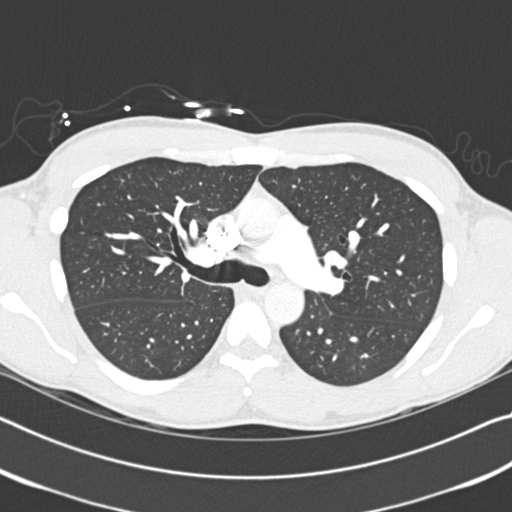
[im 154/246  mediastinal]
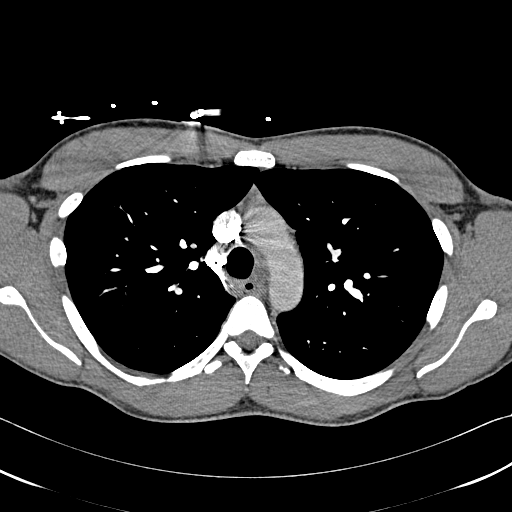
[im 169/246  lung]
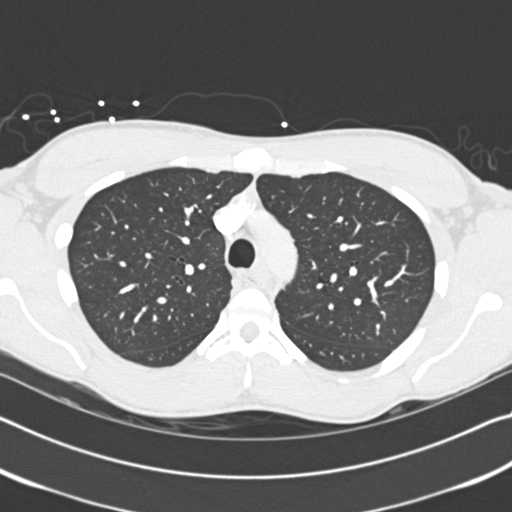
[im 184/246  mediastinal]
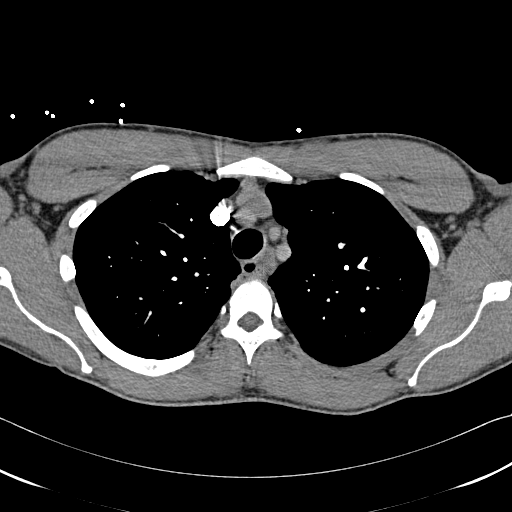
[im 200/246  lung]
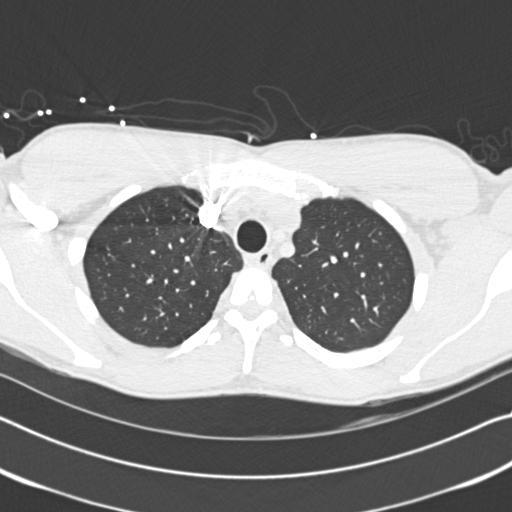
[im 215/246  mediastinal]
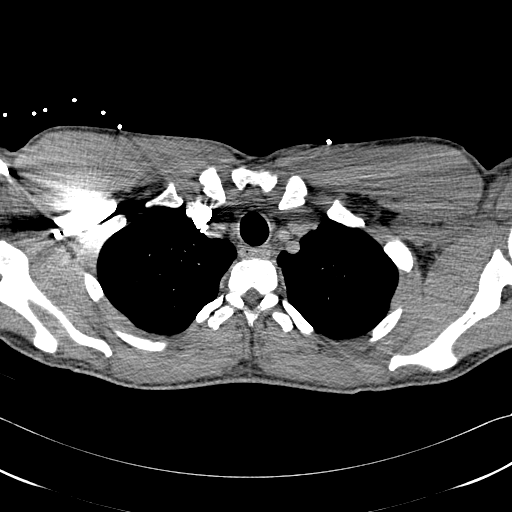
[im 230/246  lung]
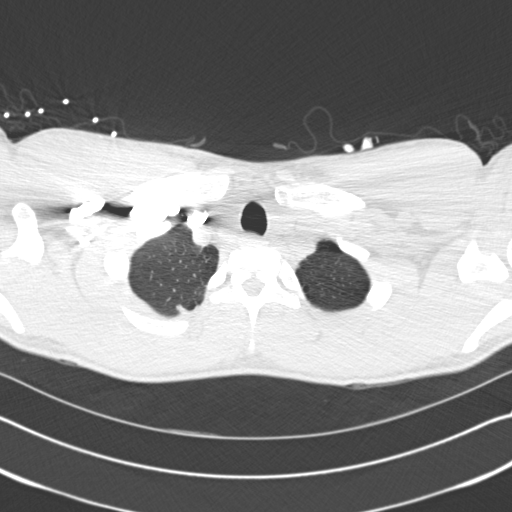

[Series 10: pe thins @ 1mm · axial · 0.66mm/px · z∈[+1148,+1166]mm · 2 of 54 slices shown (2 of 2)]
[im 18/54  lung]
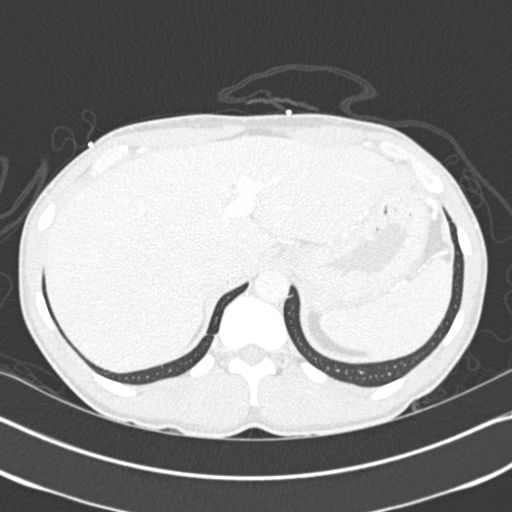
[im 36/54  lung]
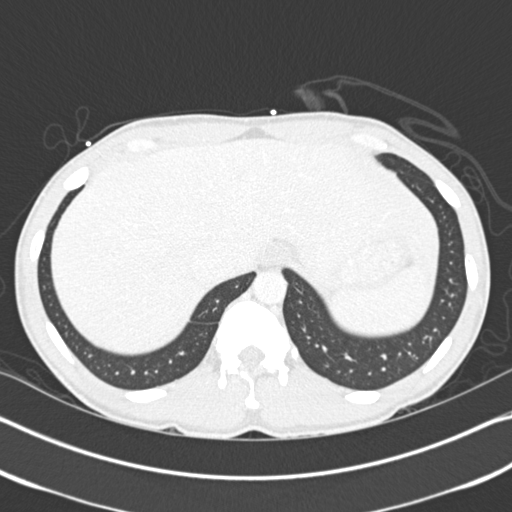

[17 of 38 positions shown; findings below may reference images not displayed]

FINDINGS: THORACIC INLET/BODY WALL:

No acute abnormality.

MEDIASTINUM:

Normal heart size. No pericardial effusion. No acute vascular
abnormality, including pulmonary embolus. Mild enlargement of sub-
carina and left peritracheal lymph nodes, presumably reactive to the
pneumonia.

LUNG WINDOWS:

Ground-glass and consolidative opacification throughout in the right
middle lobe. There is extensive contact with the pleura which could
explain chest pain. No cavitation or effusion.

UPPER ABDOMEN:

No acute findings.

OSSEOUS:

No acute fracture.  No suspicious lytic or blastic lesions.

Review of the MIP images confirms the above findings.
IMPRESSION: 1. No pulmonary embolism.
2. Right middle lobe pneumonia.

## 2016-04-07 IMAGING — US US ABDOMEN LIMITED
1 series · 14 of 25 positions shown · non-contrast
Comparison: None.

CLINICAL DATA: Abnormal liver function tests.

EXAM:
US ABDOMEN LIMITED - RIGHT UPPER QUADRANT

[Series 1: us abdomen limited · 0.21mm/px · 14 of 48 slices shown]
[im 1/48]
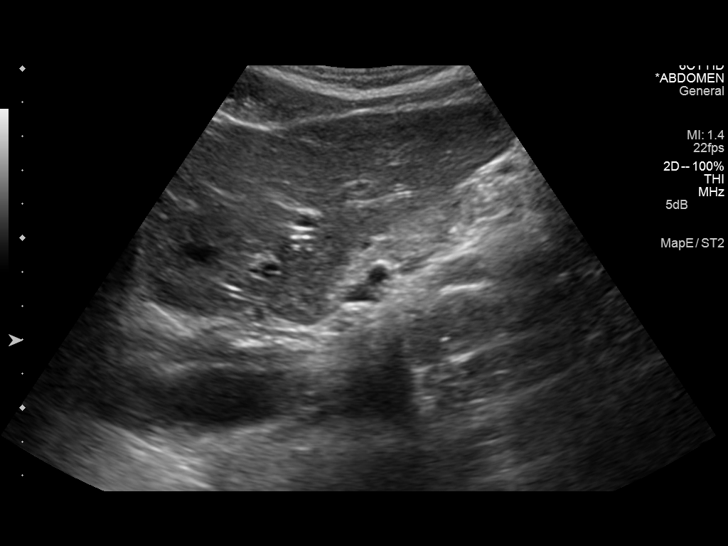
[im 4/48]
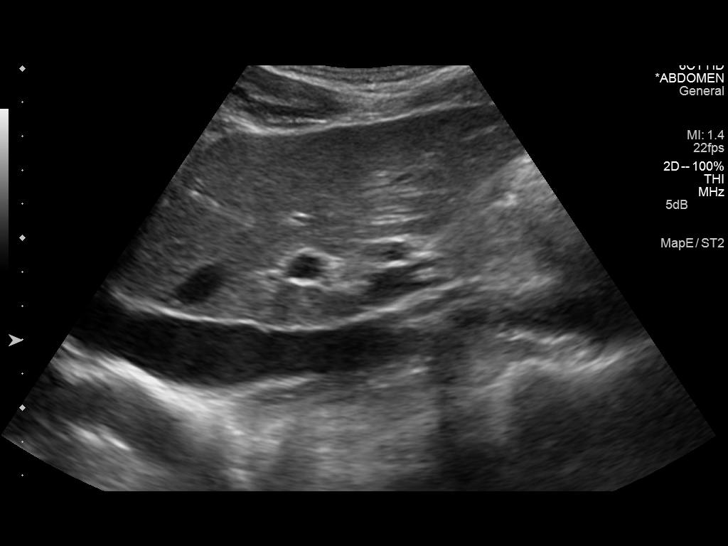
[im 8/48]
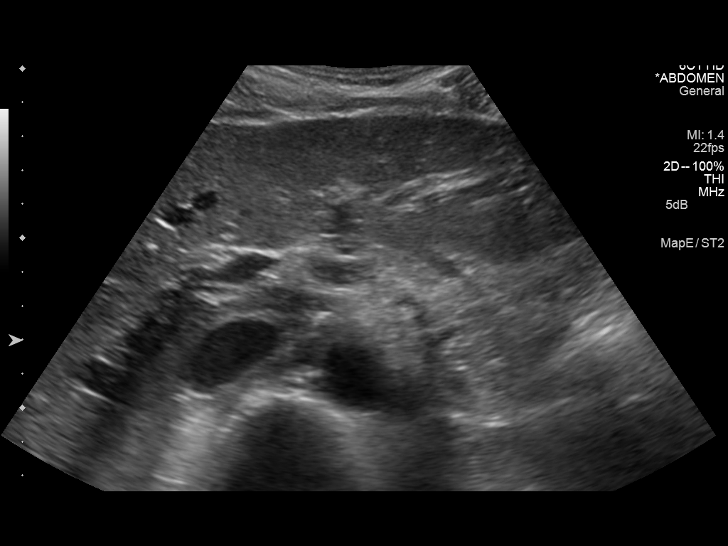
[im 12/48]
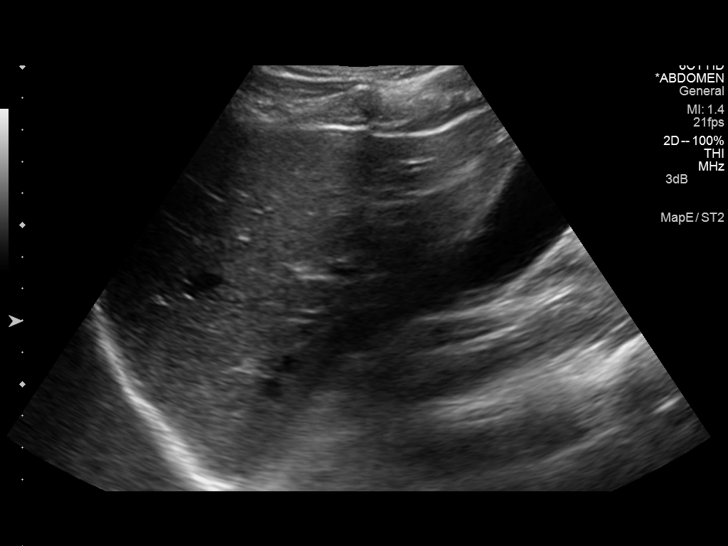
[im 16/48]
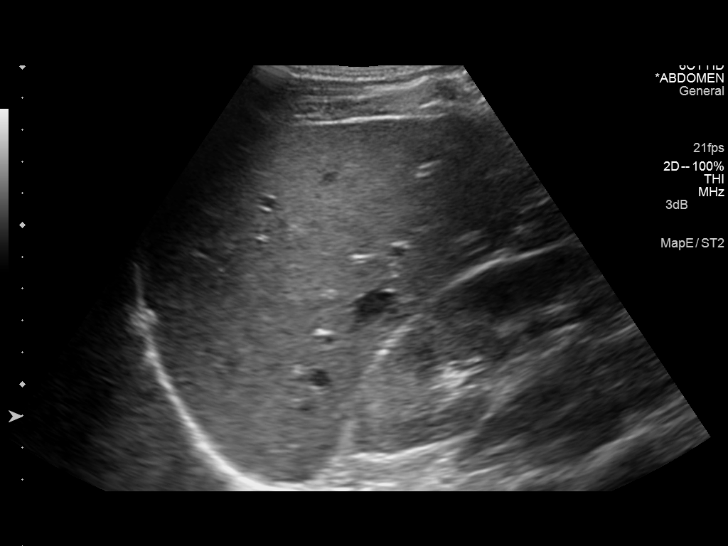
[im 18/48]
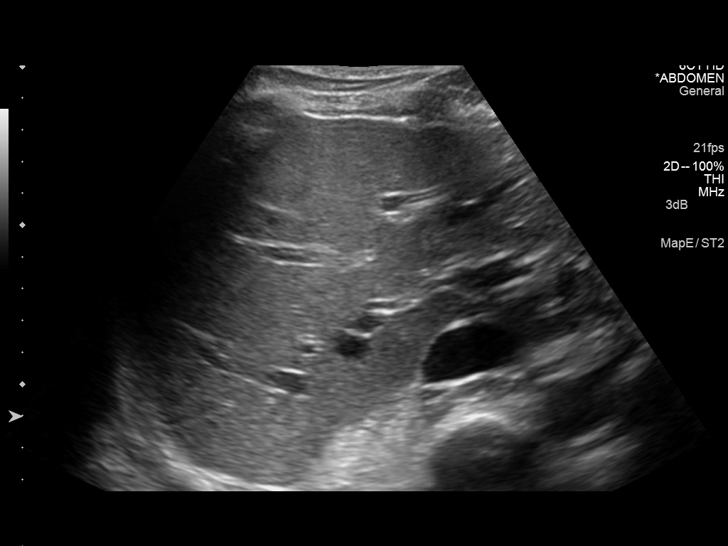
[im 22/48]
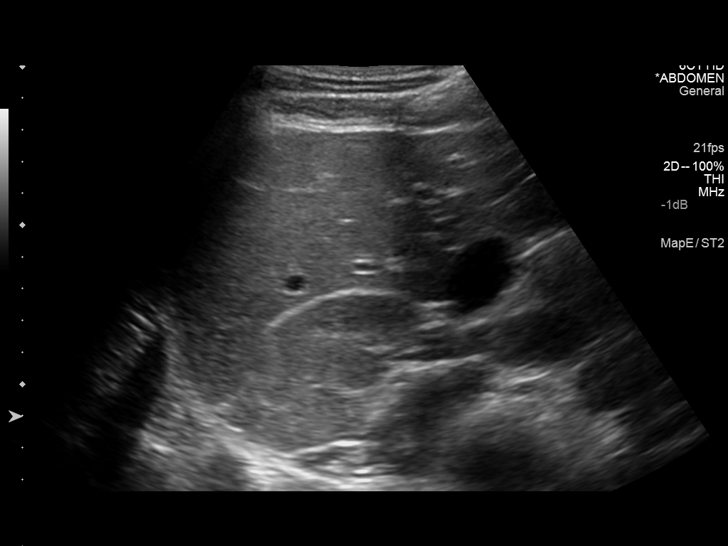
[im 26/48]
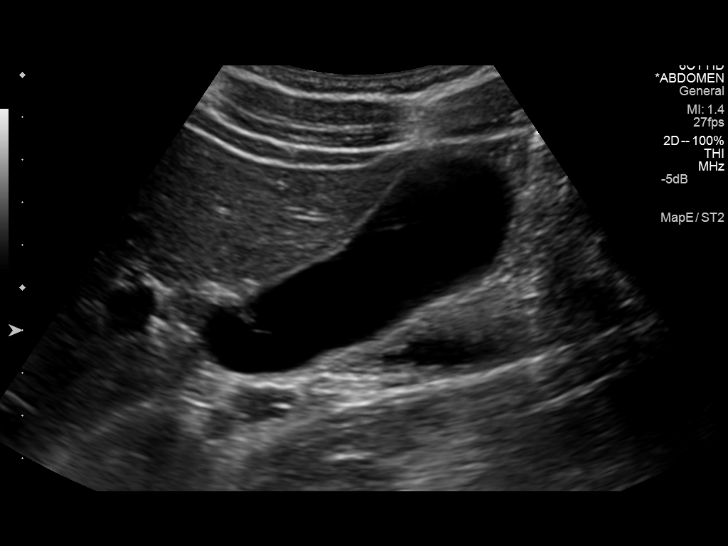
[im 30/48]
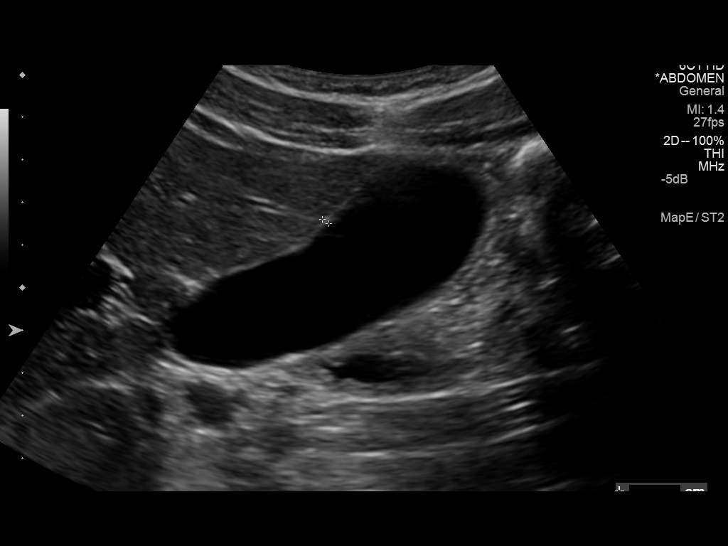
[im 32/48]
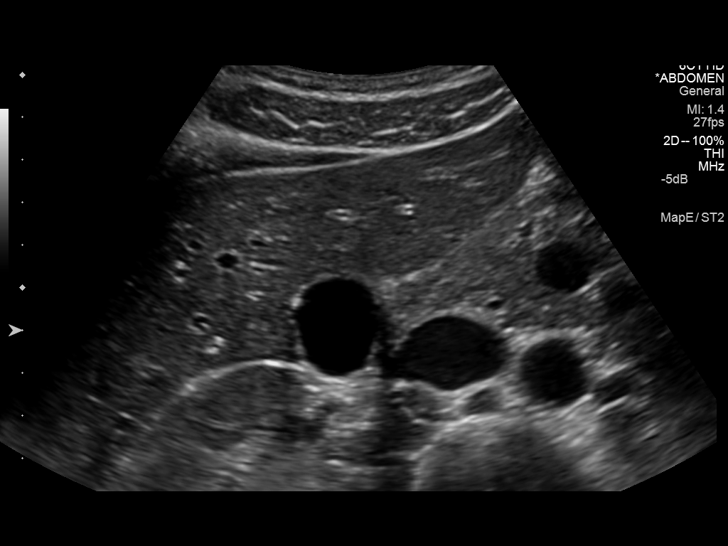
[im 36/48]
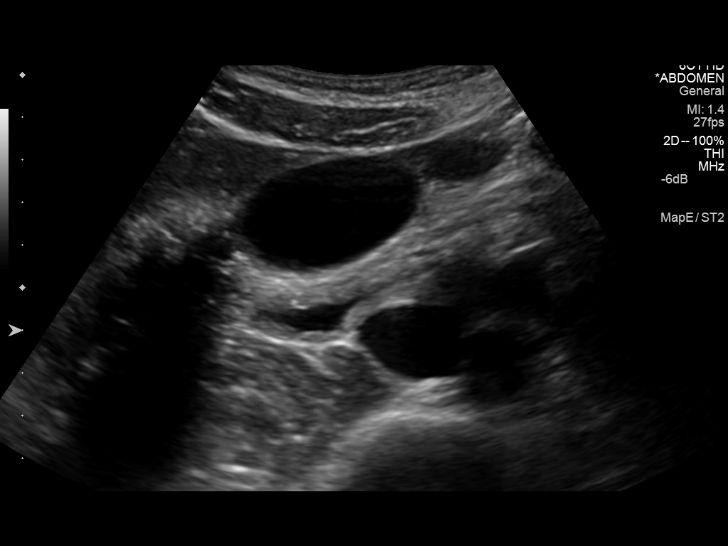
[im 40/48]
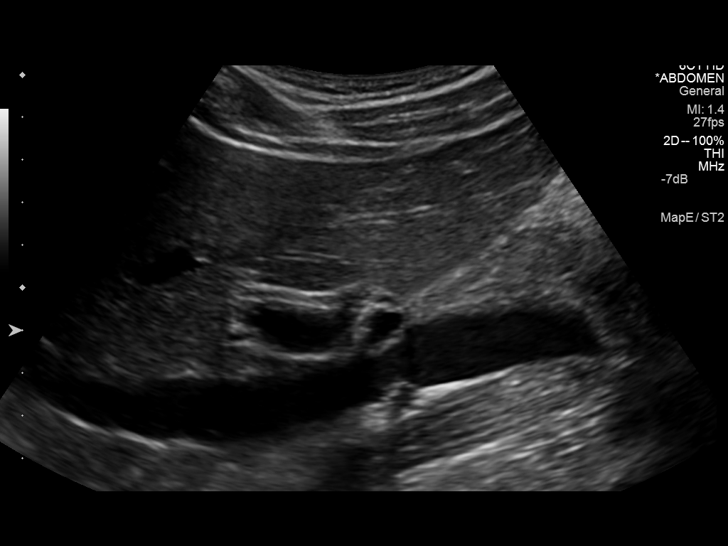
[im 44/48]
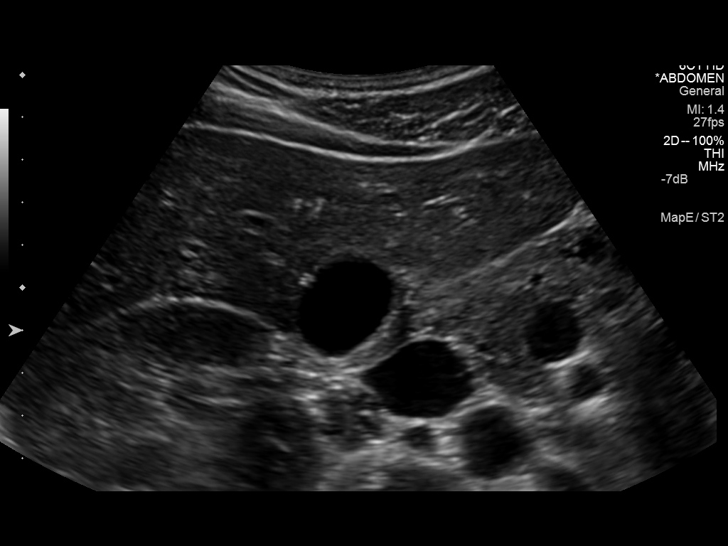
[im 48/48]
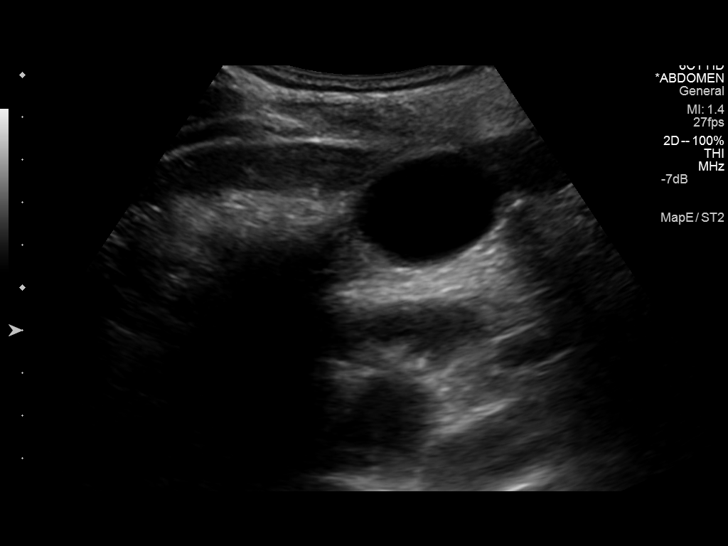

[14 of 25 positions shown; findings below may reference images not displayed]

FINDINGS: Gallbladder:

No gallstones or wall thickening visualized. No sonographic Murphy
sign noted.

Common bile duct:

Diameter: 3 mm.  Where visualized, no filling defect.

Liver:

No focal lesion identified. Within normal limits in parenchymal
echogenicity.
IMPRESSION: Normal right upper quadrant ultrasound.
